# Patient Record
Sex: Female | Born: 1955 | Race: Black or African American | Hispanic: No | Marital: Single | Smoking: Never smoker
Health system: Southern US, Community
[De-identification: ages and names within clinical notes are randomized; demographics above are authoritative.]

## PROBLEM LIST (undated history)

## (undated) DIAGNOSIS — I639 Cerebral infarction, unspecified: Secondary | ICD-10-CM

## (undated) DIAGNOSIS — J309 Allergic rhinitis, unspecified: Secondary | ICD-10-CM

## (undated) DIAGNOSIS — N289 Disorder of kidney and ureter, unspecified: Secondary | ICD-10-CM

## (undated) DIAGNOSIS — E119 Type 2 diabetes mellitus without complications: Secondary | ICD-10-CM

## (undated) DIAGNOSIS — G510 Bell's palsy: Secondary | ICD-10-CM

## (undated) DIAGNOSIS — I10 Essential (primary) hypertension: Secondary | ICD-10-CM

## (undated) DIAGNOSIS — I509 Heart failure, unspecified: Secondary | ICD-10-CM

## (undated) DIAGNOSIS — R569 Unspecified convulsions: Secondary | ICD-10-CM

## (undated) DIAGNOSIS — G629 Polyneuropathy, unspecified: Secondary | ICD-10-CM

## (undated) DIAGNOSIS — E785 Hyperlipidemia, unspecified: Secondary | ICD-10-CM

## (undated) DIAGNOSIS — I2510 Atherosclerotic heart disease of native coronary artery without angina pectoris: Secondary | ICD-10-CM

## (undated) DIAGNOSIS — E079 Disorder of thyroid, unspecified: Secondary | ICD-10-CM

## (undated) DIAGNOSIS — G4733 Obstructive sleep apnea (adult) (pediatric): Secondary | ICD-10-CM

## (undated) HISTORY — PX: BARIATRIC SURGERY: SHX1103

## (undated) HISTORY — PX: CHOLECYSTECTOMY: SHX55

## (undated) HISTORY — PX: CARDIAC DEFIBRILLATOR PLACEMENT: SHX171

## (undated) HISTORY — PX: TONSILLECTOMY: SUR1361

## (undated) HISTORY — PX: APPENDECTOMY: SHX54

## (undated) HISTORY — DX: Bell's palsy: G51.0

---

## 2018-04-29 ENCOUNTER — Emergency Department (HOSPITAL_COMMUNITY)
Admission: EM | Admit: 2018-04-29 | Discharge: 2018-04-29 | Disposition: A | Discharge status: Home / Self Care | Payer: Medicare HMO | Attending: Emergency Medicine | Admitting: Emergency Medicine

## 2018-04-29 ENCOUNTER — Encounter (HOSPITAL_COMMUNITY): Payer: Self-pay | Admitting: Emergency Medicine

## 2018-04-29 DIAGNOSIS — E119 Type 2 diabetes mellitus without complications: Secondary | ICD-10-CM | POA: Insufficient documentation

## 2018-04-29 DIAGNOSIS — I10 Essential (primary) hypertension: Secondary | ICD-10-CM | POA: Insufficient documentation

## 2018-04-29 DIAGNOSIS — H5711 Ocular pain, right eye: Secondary | ICD-10-CM | POA: Diagnosis present

## 2018-04-29 DIAGNOSIS — Z9884 Bariatric surgery status: Secondary | ICD-10-CM | POA: Insufficient documentation

## 2018-04-29 DIAGNOSIS — Z95810 Presence of automatic (implantable) cardiac defibrillator: Secondary | ICD-10-CM | POA: Diagnosis not present

## 2018-04-29 DIAGNOSIS — H5789 Other specified disorders of eye and adnexa: Secondary | ICD-10-CM | POA: Diagnosis not present

## 2018-04-29 DIAGNOSIS — Z9049 Acquired absence of other specified parts of digestive tract: Secondary | ICD-10-CM | POA: Diagnosis not present

## 2018-04-29 DIAGNOSIS — H579 Unspecified disorder of eye and adnexa: Secondary | ICD-10-CM

## 2018-04-29 HISTORY — DX: Polyneuropathy, unspecified: G62.9

## 2018-04-29 HISTORY — DX: Essential (primary) hypertension: I10

## 2018-04-29 HISTORY — DX: Type 2 diabetes mellitus without complications: E11.9

## 2018-04-29 MED ORDER — TETRACAINE HCL 0.5 % OP SOLN
2.0000 [drp] | Freq: Once | OPHTHALMIC | Status: AC
  Administered 2018-04-29: 2 [drp] via OPHTHALMIC
  Filled 2018-04-29: qty 4

## 2018-04-29 MED ORDER — FLUORESCEIN SODIUM 1 MG OP STRP
1.0000 | ORAL_STRIP | Freq: Once | OPHTHALMIC | Status: AC
  Administered 2018-04-29: 1 via OPHTHALMIC
  Filled 2018-04-29: qty 1

## 2018-04-29 NOTE — ED Provider Notes (Signed)
[facility] EMERGENCY DEPARTMENT Provider Note   CSN: [id number] Arrival date & time: 04/29/18  0330     History   Chief Complaint Chief Complaint  Patient presents with  . Eye Pain    HPI Briellah Baik is a 62 y.o. female.  62 yo F with a chief complaint of right eye pain.  The patient feels that something is stuck in there.  Been going on just since this evening.  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  She denies trauma.  Unsure of her last tetanus shot.  She denies any change in vision.  Denies contact lens use  The history is provided by the patient.  Eye Problem   This is a new problem. The current episode started 3 to 5 hours ago. The problem occurs constantly. The problem has not changed since onset.There is a problem in the right eye. There was no injury mechanism. The pain is at a severity of 2/10. The pain is mild. There is no history of trauma to the eye. There is no known exposure to pink eye. She does not wear contacts. Associated symptoms include eye redness. Pertinent negatives include no nausea and no vomiting. She has tried nothing for the symptoms. The treatment provided no relief.    Past Medical History:  Diagnosis Date  . Diabetes mellitus without complication (HCC)   . Hypertension   . Neuropathy     There are no active problems to display for this patient.   Past Surgical History:  Procedure Laterality Date  . APPENDECTOMY    . BARIATRIC SURGERY    . CARDIAC DEFIBRILLATOR PLACEMENT    . CHOLECYSTECTOMY    . TONSILLECTOMY       OB History   None      Home Medications    Prior to Admission medications   Not on File    Family History No family history on file.  Social History Social History   Tobacco Use  . Smoking status: Never Smoker  Substance Use Topics  . Alcohol use: Never    Frequency: Never  . Drug use: Never     Allergies   Codeine   Review of Systems Review of Systems  Constitutional:  Negative for chills and fever.  HENT: Negative for congestion and rhinorrhea.   Eyes: Positive for pain and redness. Negative for visual disturbance.  Respiratory: Negative for shortness of breath and wheezing.   Cardiovascular: Negative for chest pain and palpitations.  Gastrointestinal: Negative for nausea and vomiting.  Genitourinary: Negative for dysuria and urgency.  Musculoskeletal: Negative for arthralgias and myalgias.  Skin: Negative for pallor and wound.  Neurological: Negative for dizziness and headaches.     Physical Exam Updated Vital Signs BP 137/76   Pulse 81   Temp (!) 97.3 F (36.3 C) (Oral)   Resp 15   SpO2 99%   Physical Exam  Constitutional: She is oriented to person, place, and time. She appears well-developed and well-nourished. No distress.  HENT:  Head: Normocephalic and atraumatic.  Eyes: Pupils are equal, round, and reactive to light. EOM are normal. Lids are everted and swept, no foreign bodies found. Right eye exhibits discharge (watery). Right conjunctiva is injected. Right conjunctiva has no hemorrhage. Left conjunctiva is not injected. Left conjunctiva has no hemorrhage. Right eye exhibits normal extraocular motion.  No noted fluorescein uptake.  No foreign body seen.  Neck: Normal range of motion. Neck supple.  Cardiovascular: Normal rate and regular rhythm. Exam reveals no  gallop and no friction rub.  No murmur heard. Pulmonary/Chest: Effort normal. She has no wheezes. She has no rales.  Abdominal: Soft. She exhibits no distension. There is no tenderness.  Musculoskeletal: She exhibits no edema or tenderness.  Neurological: She is alert and oriented to person, place, and time.  Skin: Skin is warm and dry. She is not diaphoretic.  Psychiatric: She has a normal mood and affect. Her behavior is normal.  Nursing note and vitals reviewed.    ED Treatments / Results  Labs (all labs ordered are listed, but only abnormal results are displayed) Labs  Reviewed - No data to display  EKG None  Radiology No results found.  Procedures Procedures (including critical care time)  Medications Ordered in ED Medications  fluorescein ophthalmic strip 1 strip (1 strip Right Eye Given 04/29/18 0426)  tetracaine (PONTOCAINE) 0.5 % ophthalmic solution 2 drop (2 drops Right Eye Given 04/29/18 0426)     Initial Impression / Assessment and Plan / ED Course  I have reviewed the triage vital signs and the nursing notes.  Pertinent labs & imaging results that were available during my care of the patient were reviewed by me and considered in my medical decision making (see chart for details).     62 yo F with a chief complaint of right eye pain.  Going on since this afternoon.  My exam without visible foreign body.  This may be dry eye.  We will have her do lubricating drops.  Ophthalmology follow-up as needed.  4:33 AM:  I have discussed the diagnosis/risks/treatment options with the patient and believe the pt to be eligible for discharge home to follow-up with PCP, ophtho. We also discussed returning to the ED immediately if new or worsening sx occur. We discussed the sx which are most concerning (e.g., sudden worsening pain, vision change) that necessitate immediate return. Medications administered to the patient during their visit and any new prescriptions provided to the patient are listed below.  Medications given during this visit Medications  fluorescein ophthalmic strip 1 strip (1 strip Right Eye Given 04/29/18 0426)  tetracaine (PONTOCAINE) 0.5 % ophthalmic solution 2 drop (2 drops Right Eye Given 04/29/18 0426)      The patient appears reasonably screen and/or stabilized for discharge and I doubt any other medical condition or other [facility] requiring further screening, evaluation, or treatment in the ED at this time prior to discharge.    Final Clinical Impressions(s) / ED Diagnoses   Final diagnoses:  Sensation of foreign body in eye     ED Discharge Orders    None       Melene Plan, DO 04/29/18 [id number]

## 2018-04-29 NOTE — ED Triage Notes (Signed)
Pt reports R eye pain and watering that began last night. Denies any crusting of eye or any recent known trauma.

## 2018-04-29 NOTE — Discharge Instructions (Addendum)
Try preservative-free eyedrops.  You can buy these over-the-counter.  Open a file and use it as often as you on throughout the day and then throw it away at the end of the day.  Follow-up with your family doctor.  If this continues you may want to see an eye doctor.

## 2019-04-13 ENCOUNTER — Emergency Department (HOSPITAL_COMMUNITY): Payer: Medicare HMO

## 2019-04-13 ENCOUNTER — Encounter (HOSPITAL_COMMUNITY): Payer: Self-pay | Admitting: Emergency Medicine

## 2019-04-13 ENCOUNTER — Observation Stay (HOSPITAL_COMMUNITY)
Admission: EM | Admit: 2019-04-13 | Discharge: 2019-04-14 | Disposition: A | Discharge status: Home / Self Care | Payer: Medicare HMO | Attending: Internal Medicine | Admitting: Internal Medicine

## 2019-04-13 ENCOUNTER — Other Ambulatory Visit: Payer: Self-pay

## 2019-04-13 DIAGNOSIS — Z20828 Contact with and (suspected) exposure to other viral communicable diseases: Secondary | ICD-10-CM | POA: Diagnosis not present

## 2019-04-13 DIAGNOSIS — E119 Type 2 diabetes mellitus without complications: Secondary | ICD-10-CM | POA: Insufficient documentation

## 2019-04-13 DIAGNOSIS — Z95810 Presence of automatic (implantable) cardiac defibrillator: Secondary | ICD-10-CM | POA: Insufficient documentation

## 2019-04-13 DIAGNOSIS — I2510 Atherosclerotic heart disease of native coronary artery without angina pectoris: Secondary | ICD-10-CM | POA: Diagnosis not present

## 2019-04-13 DIAGNOSIS — R42 Dizziness and giddiness: Secondary | ICD-10-CM | POA: Diagnosis not present

## 2019-04-13 DIAGNOSIS — Z8673 Personal history of transient ischemic attack (TIA), and cerebral infarction without residual deficits: Secondary | ICD-10-CM | POA: Diagnosis not present

## 2019-04-13 DIAGNOSIS — D649 Anemia, unspecified: Secondary | ICD-10-CM | POA: Diagnosis not present

## 2019-04-13 DIAGNOSIS — I429 Cardiomyopathy, unspecified: Secondary | ICD-10-CM | POA: Diagnosis not present

## 2019-04-13 DIAGNOSIS — I10 Essential (primary) hypertension: Secondary | ICD-10-CM | POA: Diagnosis present

## 2019-04-13 DIAGNOSIS — R531 Weakness: Secondary | ICD-10-CM | POA: Diagnosis present

## 2019-04-13 DIAGNOSIS — Z79899 Other long term (current) drug therapy: Secondary | ICD-10-CM | POA: Insufficient documentation

## 2019-04-13 DIAGNOSIS — G459 Transient cerebral ischemic attack, unspecified: Secondary | ICD-10-CM

## 2019-04-13 DIAGNOSIS — R55 Syncope and collapse: Secondary | ICD-10-CM

## 2019-04-13 HISTORY — DX: Cerebral infarction, unspecified: I63.9

## 2019-04-13 HISTORY — DX: Atherosclerotic heart disease of native coronary artery without angina pectoris: I25.10

## 2019-04-13 HISTORY — DX: Disorder of thyroid, unspecified: E07.9

## 2019-04-13 HISTORY — DX: Hyperlipidemia, unspecified: E78.5

## 2019-04-13 HISTORY — DX: Allergic rhinitis, unspecified: J30.9

## 2019-04-13 HISTORY — DX: Unspecified convulsions: R56.9

## 2019-04-13 LAB — COMPREHENSIVE METABOLIC PANEL
ALT: 30 U/L (ref 0–44)
AST: 33 U/L (ref 15–41)
Albumin: 3.6 g/dL (ref 3.5–5.0)
Alkaline Phosphatase: 111 U/L (ref 38–126)
Anion gap: 10 (ref 5–15)
BUN: 19 mg/dL (ref 6–20)
CO2: 22 mmol/L (ref 22–32)
Calcium: 9.1 mg/dL (ref 8.9–10.3)
Chloride: 109 mmol/L (ref 98–111)
Creatinine, Ser: 1.36 mg/dL — ABNORMAL HIGH (ref 0.44–1.00)
GFR calc Af Amer: 49 mL/min — ABNORMAL LOW (ref >60)
GFR calc non Af Amer: 42 mL/min — ABNORMAL LOW (ref >60)
Glucose, Bld: 137 mg/dL — ABNORMAL HIGH (ref 70–99)
Potassium: 3.7 mmol/L (ref 3.5–5.1)
Sodium: 141 mmol/L (ref 135–145)
Total Bilirubin: 0.4 mg/dL (ref 0.3–1.2)
Total Protein: 6.2 g/dL — ABNORMAL LOW (ref 6.5–8.1)

## 2019-04-13 LAB — DIFFERENTIAL
Abs Immature Granulocytes: 0.01 10*3/uL (ref 0.00–0.07)
Basophils Absolute: 0 10*3/uL (ref 0.0–0.1)
Basophils Relative: 1 %
Eosinophils Absolute: 0.1 10*3/uL (ref 0.0–0.5)
Eosinophils Relative: 2 %
Immature Granulocytes: 0 %
Lymphocytes Relative: 48 %
Lymphs Abs: 2.8 10*3/uL (ref 0.7–4.0)
Monocytes Absolute: 0.3 10*3/uL (ref 0.1–1.0)
Monocytes Relative: 6 %
Neutro Abs: 2.5 10*3/uL (ref 1.7–7.7)
Neutrophils Relative %: 43 %

## 2019-04-13 LAB — ETHANOL: Alcohol, Ethyl (B): <10 mg/dL (ref <10)

## 2019-04-13 LAB — CBC
HCT: 32.9 % — ABNORMAL LOW (ref 36.0–46.0)
HCT: 33.2 % — ABNORMAL LOW (ref 36.0–46.0)
Hemoglobin: 10.1 g/dL — ABNORMAL LOW (ref 12.0–15.0)
Hemoglobin: 10.5 g/dL — ABNORMAL LOW (ref 12.0–15.0)
MCH: 22.5 pg — ABNORMAL LOW (ref 26.0–34.0)
MCH: 22.8 pg — ABNORMAL LOW (ref 26.0–34.0)
MCHC: 30.7 g/dL (ref 30.0–36.0)
MCHC: 31.6 g/dL (ref 30.0–36.0)
MCV: 73.4 fL — ABNORMAL LOW (ref 80.0–100.0)
MCV: 72.2 fL — ABNORMAL LOW (ref 80.0–100.0)
Platelets: 167 10*3/uL (ref 150–400)
Platelets: 164 10*3/uL (ref 150–400)
RBC: 4.48 MIL/uL (ref 3.87–5.11)
RBC: 4.6 MIL/uL (ref 3.87–5.11)
RDW: 15.5 % (ref 11.5–15.5)
RDW: 15.7 % — ABNORMAL HIGH (ref 11.5–15.5)
WBC: 5.9 10*3/uL (ref 4.0–10.5)
WBC: 7.9 10*3/uL (ref 4.0–10.5)
nRBC: 0 % (ref 0.0–0.2)
nRBC: 0 % (ref 0.0–0.2)

## 2019-04-13 LAB — PROTIME-INR
INR: 1.2 (ref 0.8–1.2)
Prothrombin Time: 15.1 seconds (ref 11.4–15.2)

## 2019-04-13 LAB — HEMOGLOBIN A1C
Hgb A1c MFr Bld: 6 % — ABNORMAL HIGH (ref 4.8–5.6)
Mean Plasma Glucose: 125.5 mg/dL

## 2019-04-13 LAB — I-STAT CHEM 8, ED
BUN: 23 mg/dL — ABNORMAL HIGH (ref 6–20)
Calcium, Ion: 1.17 mmol/L (ref 1.15–1.40)
Chloride: 108 mmol/L (ref 98–111)
Creatinine, Ser: 1.3 mg/dL — ABNORMAL HIGH (ref 0.44–1.00)
Glucose, Bld: 130 mg/dL — ABNORMAL HIGH (ref 70–99)
HCT: 34 % — ABNORMAL LOW (ref 36.0–46.0)
Hemoglobin: 11.6 g/dL — ABNORMAL LOW (ref 12.0–15.0)
Potassium: 3.7 mmol/L (ref 3.5–5.1)
Sodium: 142 mmol/L (ref 135–145)
TCO2: 22 mmol/L (ref 22–32)

## 2019-04-13 LAB — I-STAT BETA HCG BLOOD, ED (MC, WL, AP ONLY): I-stat hCG, quantitative: <5 m[IU]/mL (ref <5)

## 2019-04-13 LAB — CBG MONITORING, ED: Glucose-Capillary: 128 mg/dL — ABNORMAL HIGH (ref 70–99)

## 2019-04-13 LAB — TROPONIN I (HIGH SENSITIVITY): Troponin I (High Sensitivity): <2 ng/L (ref <18)

## 2019-04-13 LAB — APTT: aPTT: 27 seconds (ref 24–36)

## 2019-04-13 LAB — LACTIC ACID, PLASMA: Lactic Acid, Venous: 1.1 mmol/L (ref 0.5–1.9)

## 2019-04-13 MED ORDER — ACETAMINOPHEN 160 MG/5ML PO SOLN: 650.0000 mg | ORAL | Status: DC | PRN

## 2019-04-13 MED ORDER — IOHEXOL 350 MG/ML SOLN
100.0000 mL | Freq: Once | INTRAVENOUS | Status: AC | PRN
  Administered 2019-04-13: 100 mL via INTRAVENOUS

## 2019-04-13 MED ORDER — ASPIRIN 300 MG RE SUPP: 300.0000 mg | Freq: Every day | RECTAL | Status: DC

## 2019-04-13 MED ORDER — IOHEXOL 350 MG/ML SOLN
80.0000 mL | Freq: Once | INTRAVENOUS | Status: AC | PRN
  Administered 2019-04-13: 80 mL via INTRAVENOUS

## 2019-04-13 MED ORDER — INSULIN ASPART 100 UNIT/ML ~~LOC~~ SOLN: 0.0000 [IU] | SUBCUTANEOUS | Status: DC

## 2019-04-13 MED ORDER — ENOXAPARIN SODIUM 40 MG/0.4ML ~~LOC~~ SOLN
40.0000 mg | Freq: Every day | SUBCUTANEOUS | Status: DC
  Administered 2019-04-14: 40 mg via SUBCUTANEOUS
  Filled 2019-04-13: qty 0.4

## 2019-04-13 MED ORDER — SODIUM CHLORIDE 0.9 % IV SOLN
INTRAVENOUS | Status: AC
  Administered 2019-04-14: 01:00:00 via INTRAVENOUS

## 2019-04-13 MED ORDER — ASPIRIN 325 MG PO TABS
325.0000 mg | ORAL_TABLET | Freq: Every day | ORAL | Status: DC
  Administered 2019-04-14: 325 mg via ORAL
  Filled 2019-04-13: qty 1

## 2019-04-13 MED ORDER — ACETAMINOPHEN 325 MG PO TABS: 650.0000 mg | ORAL_TABLET | ORAL | Status: DC | PRN

## 2019-04-13 MED ORDER — ACETAMINOPHEN 650 MG RE SUPP: 650.0000 mg | RECTAL | Status: DC | PRN

## 2019-04-13 MED ORDER — STROKE: EARLY STAGES OF RECOVERY BOOK
Freq: Once | Status: AC
  Administered 2019-04-14: 01:00:00

## 2019-04-13 NOTE — ED Notes (Signed)
Pt failed swallow screen previously. Will continue to keep NPO

## 2019-04-13 NOTE — ED Notes (Signed)
Pt transported to CT ?

## 2019-04-13 NOTE — H&P (Signed)
History and Physical    Alexandra Ingram BTD:[id number] DOB: 05-20-1956 DOA: 04/13/2019  PCP: Shon Hough, MD  Patient coming from: Home.  Chief Complaint: Left-sided weakness.  Difficulty speaking.  History obtained from patient's daughter.  HPI: Alexandra Ingram is a 63 y.o. female with previous history of stroke with left-sided weakness, cardiomyopathy status post AICD, hypertension usually follows at [facility] is presently living at patient's daughter's house last few months at around 4 PM started having difficulty getting up from the commode and EMS was called.  Patient was noticed to have left facial droop and left-sided weakness and was brought to the ER.  Patient also had some speech difficulties.  ED Course: In the ER CT head followed by CT angiogram of the head and neck did not show any large vessel obstruction.  On exam patient has weakness of the left upper and lower extremity.  Patient failed swallow evaluation.  Neurology has examined the patient at this time admitted for possible TIA versus stroke.  Unable to do MRI due to AICD.  Labs show WBC of 7.9 hemoglobin 10.5 platelets 164 creatinine 1.02 sodium 140.  EKG normal sinus rhythm.  Patient blood pressure was in the low normal and at this time neurology recommended to keep blood pressure high.  Review of Systems: As per HPI, rest all negative.   Past Medical History:  Diagnosis Date   Allergic rhinitis    Coronary artery disease    CVA (cerebral vascular accident) ([place])    Diabetes mellitus without complication ([place])    Hyperlipidemia    Hypertension    Neuropathy    Seizures ([place])    Stroke ([place])    Thyroid disease     Past Surgical History:  Procedure Laterality Date   APPENDECTOMY     BARIATRIC SURGERY     CARDIAC DEFIBRILLATOR PLACEMENT     CHOLECYSTECTOMY     TONSILLECTOMY       reports that she has never smoked. She has never used smokeless tobacco. She reports that she  does not drink alcohol or use drugs.  Allergies  Allergen Reactions   Codeine Anaphylaxis    Family History  Problem Relation Age of Onset   Stroke Sister    Diabetes Mellitus II Sister    Stroke Brother     Prior to Admission medications   Medication Sig Start Date End Date Taking? Authorizing Provider  atorvastatin (LIPITOR) 40 MG tablet Take 40 mg by mouth daily.   Yes [provider]  carvedilol (COREG) 12.5 MG tablet Take 12.5 mg by mouth 2 (two) times daily with a meal.   Yes [provider]  gabapentin (NEURONTIN) 300 MG capsule Take 300 mg by mouth 2 (two) times daily.   Yes [provider]  lisinopril (ZESTRIL) 5 MG tablet Take 5 mg by mouth daily.   Yes [provider]  mirabegron ER (MYRBETRIQ) 25 MG TB24 tablet Take 25 mg by mouth daily. 02/14/19 02/14/20 Yes [provider]  Semaglutide, 1 MG/DOSE, 2 MG/1.5ML SOPN Inject 1 mg into the skin every 7 (seven) days. Patient uses on Friday of each week   Yes [provider]  spironolactone (ALDACTONE) 25 MG tablet Take 25 mg by mouth daily. 11/22/18  Yes [provider]  tolterodine (DETROL LA) 2 MG 24 hr capsule Take 2 mg by mouth at bedtime. 10/07/18  Yes [provider]    Physical Exam: Constitutional: Moderately built and nourished. Vitals:   04/13/19  2030 04/13/19 2045 04/13/19 2115 04/13/19 2145  BP: 125/75 126/76 124/79   Pulse: 77 77 77 85  Resp: Temp:      SpO2: 97% 96% 97% 98%   Eyes: Anicteric no pallor. ENMT: No discharge from the ears eyes nose and mouth. Neck: No mass felt.  No neck rigidity. Respiratory: No rhonchi or crepitations. Cardiovascular: S1-S2 heard. Abdomen: Soft nontender bowel sounds present. Musculoskeletal: No edema. Skin: No rash. Neurologic: Alert awake oriented to time place and person.  Has left upper extremity 0 x 5 with left lower extremity 4 x 5.  No facial asymmetry.  Tongue is  midline. Psychiatric: Appears normal.   Labs on Admission: I have personally reviewed following labs and imaging studies  CBC: Recent Labs  Lab 04/13/19 1630 04/13/19 1638  WBC 5.9  --   NEUTROABS 2.5  --   HGB 10.1* 11.6*  HCT 32.9* 34.0*  MCV 73.4*  --   PLT 167  --    Basic Metabolic Panel: Recent Labs  Lab 04/13/19 1630 04/13/19 1638  NA 141 142  K 3.7 3.7  CL 109 108  CO2 22  --   GLUCOSE 137* 130*  BUN 19 23*  CREATININE 1.36* 1.30*  CALCIUM 9.1  --    GFR: CrCl cannot be calculated (Unknown ideal weight.). Liver Function Tests: Recent Labs  Lab 04/13/19 1630  AST 33  ALT 30  ALKPHOS 111  BILITOT 0.4  PROT 6.2*  ALBUMIN 3.6   No results for input(s): LIPASE, AMYLASE in the last 168 hours. No results for input(s): AMMONIA in the last 168 hours. Coagulation Profile: Recent Labs  Lab 04/13/19 1719  INR 1.2   Cardiac Enzymes: No results for input(s): CKTOTAL, CKMB, CKMBINDEX, TROPONINI in the last 168 hours. BNP (last 3 results) No results for input(s): PROBNP in the last 8760 hours. HbA1C: No results for input(s): HGBA1C in the last 72 hours. CBG: Recent Labs  Lab 04/13/19 1630  GLUCAP 128*   Lipid Profile: No results for input(s): CHOL, HDL, LDLCALC, TRIG, CHOLHDL, LDLDIRECT in the last 72 hours. Thyroid Function Tests: No results for input(s): TSH, T4TOTAL, FREET4, T3FREE, THYROIDAB in the last 72 hours. Anemia Panel: No results for input(s): VITAMINB12, FOLATE, FERRITIN, TIBC, IRON, RETICCTPCT in the last 72 hours. Urine analysis: No results found for: COLORURINE, APPEARANCEUR, LABSPEC, PHURINE, GLUCOSEU, HGBUR, BILIRUBINUR, KETONESUR, PROTEINUR, UROBILINOGEN, NITRITE, LEUKOCYTESUR Sepsis Labs: (procalcitonin:4,lacticidven:4) )No results found for this or any previous visit (from the past 240 hour(s)).   Radiological Exams on Admission: Ct Code Stroke Cta Head W/wo Contrast  Result Date: 04/13/2019 CLINICAL DATA:   Stroke.  Left-sided weakness and facial droop EXAM: CT ANGIOGRAPHY HEAD AND NECK CT PERFUSION BRAIN TECHNIQUE: Multidetector CT imaging of the head and neck was performed using the standard protocol during bolus administration of intravenous contrast. Multiplanar CT image reconstructions and MIPs were obtained to evaluate the vascular anatomy. Carotid stenosis measurements (when applicable) are obtained utilizing NASCET criteria, using the distal internal carotid diameter as the denominator. Multiphase CT imaging of the brain was performed following IV bolus contrast injection. Subsequent parametric perfusion maps were calculated using RAPID software. CONTRAST:  OMNIPAQUE IOHEXOL 350 MG/ML SOLN COMPARISON:  CT head 04/13/2019 FINDINGS: CTA NECK FINDINGS Aortic arch: Standard branching. Imaged portion shows no evidence of aneurysm or dissection. No significant stenosis of the major arch vessel origins. Right carotid system: No evidence of dissection, right carotid widely patent. Minimal calcification right carotid bulb  without stenosis Left carotid system: Left carotid widely patent without stenosis. No significant atherosclerotic disease. Vertebral arteries: Calcific plaque at the origin of the left vertebral artery with mild-to-moderate stenosis. Remainder of the left vertebral artery is patent to the basilar. Right vertebral artery widely patent without stenosis. Skeleton: Cervical spondylosis.  No acute skeletal abnormality. Other neck: Negative for mass or adenopathy in the neck. Upper chest: Lung apices clear bilaterally. Review of the MIP images confirms the above findings CTA HEAD FINDINGS Anterior circulation: Cavernous carotid widely patent bilaterally without stenosis. Anterior and middle cerebral arteries patent bilaterally without stenosis or large vessel occlusion Posterior circulation: Both vertebral arteries widely patent to the basilar. PICA not visualized. AICA patent bilaterally basilar  widely patent. Superior cerebellar and posterior cerebral arteries patent bilaterally. Venous sinuses: Normal venous enhancement Anatomic variants: None Review of the MIP images confirms the above findings CT Brain Perfusion Findings: ASPECTS: 10 CBF (<30%) Volume: 0mL Perfusion (Tmax>6.0s) volume: 0mL Mismatch Volume: 0mL Infarction Location:None IMPRESSION: 1. CT perfusion negative for acute infarct or ischemia 2. Negative for emergent large vessel intracranial occlusion 3. No significant carotid stenosis. Mild to moderate stenosis origin of left vertebral artery. Electronically Signed   By: Marlan Palau M.D.   On: 04/13/2019 17:20   Ct Head Wo Contrast  Result Date: 04/13/2019 CLINICAL DATA:  Evaluate for infarct. EXAM: CT HEAD WITHOUT CONTRAST TECHNIQUE: Contiguous axial images were obtained from the base of the skull through the vertex without intravenous contrast. COMPARISON:  Earlier today FINDINGS: Brain: No evidence of acute infarction, hemorrhage, hydrocephalus, extra-axial collection or mass lesion/mass effect. Vascular: No hyperdense vessel or unexpected calcification. Skull: Normal. Negative for fracture or focal lesion. Sinuses/Orbits: No acute finding. Other: None IMPRESSION: No acute intracranial abnormalities. Normal brain. Electronically Signed   By: Signa Kell M.D.   On: 04/13/2019 19:41   Ct Code Stroke Cta Neck W/wo Contrast  Result Date: 04/13/2019 CLINICAL DATA:  Stroke.  Left-sided weakness and facial droop EXAM: CT ANGIOGRAPHY HEAD AND NECK CT PERFUSION BRAIN TECHNIQUE: Multidetector CT imaging of the head and neck was performed using the standard protocol during bolus administration of intravenous contrast. Multiplanar CT image reconstructions and MIPs were obtained to evaluate the vascular anatomy. Carotid stenosis measurements (when applicable) are obtained utilizing NASCET criteria, using the distal internal carotid diameter as the denominator. Multiphase CT imaging of  the brain was performed following IV bolus contrast injection. Subsequent parametric perfusion maps were calculated using RAPID software. CONTRAST:  OMNIPAQUE IOHEXOL 350 MG/ML SOLN COMPARISON:  CT head 04/13/2019 FINDINGS: CTA NECK FINDINGS Aortic arch: Standard branching. Imaged portion shows no evidence of aneurysm or dissection. No significant stenosis of the major arch vessel origins. Right carotid system: No evidence of dissection, right carotid widely patent. Minimal calcification right carotid bulb without stenosis Left carotid system: Left carotid widely patent without stenosis. No significant atherosclerotic disease. Vertebral arteries: Calcific plaque at the origin of the left vertebral artery with mild-to-moderate stenosis. Remainder of the left vertebral artery is patent to the basilar. Right vertebral artery widely patent without stenosis. Skeleton: Cervical spondylosis.  No acute skeletal abnormality. Other neck: Negative for mass or adenopathy in the neck. Upper chest: Lung apices clear bilaterally. Review of the MIP images confirms the above findings CTA HEAD FINDINGS Anterior circulation: Cavernous carotid widely patent bilaterally without stenosis. Anterior and middle cerebral arteries patent bilaterally without stenosis or large vessel occlusion Posterior circulation: Both vertebral arteries widely patent to the basilar. PICA not visualized. AICA patent  bilaterally basilar widely patent. Superior cerebellar and posterior cerebral arteries patent bilaterally. Venous sinuses: Normal venous enhancement Anatomic variants: None Review of the MIP images confirms the above findings CT Brain Perfusion Findings: ASPECTS: 10 CBF (<30%) Volume: 70mL Perfusion (Tmax>6.0s) volume: 53mL Mismatch Volume: 100mL Infarction Location:None IMPRESSION: 1. CT perfusion negative for acute infarct or ischemia 2. Negative for emergent large vessel intracranial occlusion 3. No significant carotid stenosis. Mild to  moderate stenosis origin of left vertebral artery. Electronically Signed   By: Marlan Palau M.D.   On: 04/13/2019 17:20   Ct Code Stroke Cta Cerebral Perfusion W/wo Contrast  Result Date: 04/13/2019 CLINICAL DATA:  Stroke.  Left-sided weakness and facial droop EXAM: CT ANGIOGRAPHY HEAD AND NECK CT PERFUSION BRAIN TECHNIQUE: Multidetector CT imaging of the head and neck was performed using the standard protocol during bolus administration of intravenous contrast. Multiplanar CT image reconstructions and MIPs were obtained to evaluate the vascular anatomy. Carotid stenosis measurements (when applicable) are obtained utilizing NASCET criteria, using the distal internal carotid diameter as the denominator. Multiphase CT imaging of the brain was performed following IV bolus contrast injection. Subsequent parametric perfusion maps were calculated using RAPID software. CONTRAST:  OMNIPAQUE IOHEXOL 350 MG/ML SOLN COMPARISON:  CT head 04/13/2019 FINDINGS: CTA NECK FINDINGS Aortic arch: Standard branching. Imaged portion shows no evidence of aneurysm or dissection. No significant stenosis of the major arch vessel origins. Right carotid system: No evidence of dissection, right carotid widely patent. Minimal calcification right carotid bulb without stenosis Left carotid system: Left carotid widely patent without stenosis. No significant atherosclerotic disease. Vertebral arteries: Calcific plaque at the origin of the left vertebral artery with mild-to-moderate stenosis. Remainder of the left vertebral artery is patent to the basilar. Right vertebral artery widely patent without stenosis. Skeleton: Cervical spondylosis.  No acute skeletal abnormality. Other neck: Negative for mass or adenopathy in the neck. Upper chest: Lung apices clear bilaterally. Review of the MIP images confirms the above findings CTA HEAD FINDINGS Anterior circulation: Cavernous carotid widely patent bilaterally without stenosis. Anterior and  middle cerebral arteries patent bilaterally without stenosis or large vessel occlusion Posterior circulation: Both vertebral arteries widely patent to the basilar. PICA not visualized. AICA patent bilaterally basilar widely patent. Superior cerebellar and posterior cerebral arteries patent bilaterally. Venous sinuses: Normal venous enhancement Anatomic variants: None Review of the MIP images confirms the above findings CT Brain Perfusion Findings: ASPECTS: 10 CBF (<30%) Volume: 8mL Perfusion (Tmax>6.0s) volume: 28mL Mismatch Volume: 31mL Infarction Location:None IMPRESSION: 1. CT perfusion negative for acute infarct or ischemia 2. Negative for emergent large vessel intracranial occlusion 3. No significant carotid stenosis. Mild to moderate stenosis origin of left vertebral artery. Electronically Signed   By: Marlan Palau M.D.   On: 04/13/2019 17:20   Ct Angio Chest/abd/pel For Dissection W And/or Wo Contrast  Result Date: 04/13/2019 CLINICAL DATA:  Chest and back pain, question of aortic dissection EXAM: CT ANGIOGRAPHY CHEST, ABDOMEN AND PELVIS TECHNIQUE: Multidetector CT imaging through the chest, abdomen and pelvis was performed using the standard protocol during bolus administration of intravenous contrast. Multiplanar reconstructed images and MIPs were obtained and reviewed to evaluate the vascular anatomy. CONTRAST:  3mL OMNIPAQUE IOHEXOL 350 MG/ML SOLN COMPARISON:  None. FINDINGS: CTA CHEST FINDINGS Cardiovascular: --Heart: The heart size is normal.  There is nopericardial effusion. --Aorta: The course and caliber of the thoracic aorta are normal. There is mild scattered aortic atherosclerotic calcification. Precontrast images show no aortic intramural hematoma. There is no blood pool,  dissection or penetrating ulcer demonstrated on arterial phase postcontrast imaging. There is a conventional 3 vessel aortic arch branching pattern. The proximal arch vessels are widely patent. --Pulmonary Arteries:  Contrast timing is optimized for preferential opacification of the aorta. Within that limitation, normal central pulmonary arteries. Mediastinum/Nodes: A left-sided pacemaker seen with the lead tips in the right ventricle. No mediastinal, hilar or axillary lymphadenopathy. The visualized thyroid and thoracic esophageal course are unremarkable. Lungs/Pleura: No pulmonary nodules or masses. No pleural effusion or pneumothorax. No focal airspace consolidation. No focal pleural abnormality. Musculoskeletal: No chest wall abnormality. No acute osseous findings. Review of the MIP images confirms the above findings. CTA ABDOMEN AND PELVIS FINDINGS VASCULAR Aorta: Normal caliber aorta without aneurysm, dissection, vasculitis or hemodynamically significant stenosis. There is mild aortic atherosclerosis. Celiac: No aneurysm, dissection or hemodynamically significant stenosis. Normal branching pattern SMA: Widely patent without dissection or stenosis. Renals: Single renal arteries bilaterally. No aneurysm, dissection, stenosis or evidence of fibromuscular dysplasia. IMA: Patent without abnormality. Inflow: No aneurysm, stenosis or dissection. Veins: Normal course and caliber of the major veins. Assessment is otherwise limited by the arterial dominant contrast phase. Review of the MIP images confirms the above findings. NON-VASCULAR Hepatobiliary: Normal hepatic contours and density. No visible biliary dilatation. The patient is status post cholecystectomy. No biliary ductal dilation. Pancreas: Normal contours without ductal dilatation. No peripancreatic fluid collection. Spleen: Normal arterial phase splenic enhancement pattern. Adrenals/Urinary Tract: --Adrenal glands: Normal. --Right kidney/ureter: Multiple scattered renal calculi are noted, less than 1 cm. No hydronephrosis however is seen. --Left kidney/ureter: Multiple scattered renal calculi are noted, less than 1 cm. No hydronephrosis. There is a low-density 1 cm lesion  seen in the upper pole the left kidney. --Urinary bladder: Unremarkable. Stomach/Bowel: --Stomach/Duodenum: A small hiatal hernia is present. The patient has had prior gastric sleeve. --Small bowel: No dilatation or inflammation. --Colon: Scattered colonic diverticula are noted without diverticulitis. --Appendix: Normal. Lymphatic:  No abdominal or pelvic lymphadenopathy. Reproductive: No free fluid in the pelvis. Musculoskeletal. No bony spinal canal stenosis or focal osseous abnormality. Degenerative changes are seen in the lower thoracic spine. Other: None. Review of the MIP images confirms the above findings. IMPRESSION: 1. No acute aortic abnormality. 2. Mild aortic Atherosclerosis (ICD10-I70.0). 3. Multiple bilateral subcentimeter nonobstructing renal calculi. 4. No acute intrathoracic pathology. Electronically Signed   By: Jonna Clark M.D.   On: 04/13/2019 21:54   Ct Head Code Stroke Wo Contrast  Result Date: 04/13/2019 CLINICAL DATA:  Code stroke.  Left-sided weakness and facial droop. EXAM: CT HEAD WITHOUT CONTRAST TECHNIQUE: Contiguous axial images were obtained from the base of the skull through the vertex without intravenous contrast. COMPARISON:  None. FINDINGS: Brain: Normal appearance of brain parenchyma without evidence of old or acute infarction, mass lesion, hemorrhage, hydrocephalus or extra-axial collection. Vascular: There is atherosclerotic calcification of the major vessels at the base of the brain. Skull: Normal Sinuses/Orbits: Clear Other: None ASPECTS ([place] Stroke Program Early CT Score) - Ganglionic level infarction (caudate, lentiform nuclei, internal capsule, insula, M1-M3 cortex): 7 - Supraganglionic infarction (M4-M6 cortex): 3 Total score (0-10 with 10 being normal): 10 IMPRESSION: 1. Normal appearance of the brain parenchyma. No sign of old or acute infarction. There is atherosclerotic calcification of the major vessels at the base of the brain. 2. ASPECTS is 10 3. These  results were communicated to Dr. Otelia Limes at 4:44 pmon 10/22/2020by text page via the [facility] messaging system. Electronically Signed   By: Paulina Fusi M.D.   On:  04/13/2019 16:45    EKG: Independently reviewed.  Normal sinus rhythm  Assessment/Plan Principal Problem:   TIA (transient ischemic attack) Active Problems:   Cardiomyopathy (HCC)   History of stroke   Normocytic normochromic anemia   Essential hypertension    1. TIA versus stroke -unable to do MRI brain because of patient having AICD will admit repeat CT head in 24 hours.  Patient failed swallow will need to get speech therapist consult.  Physical therapy consult.  Continue aspirin and statins.  Check hemoglobin A1c lipid panel.  Neurochecks. 2. History of hypertension cardiomyopathy holding diuretics and antihypertensives due to low normal blood pressure.  Concerning for watershed infarct. 3. Anemia follow CBC. 4. Diabetes mellitus type 2 we will keep patient on sliding scale coverage.   DVT prophylaxis: Lovenox. Code Status: Full code. Family Communication: Patient's daughter. Disposition Plan: Home. Consults called: Neurology. Admission status: Observation.   Eduard ClosArshad N Kynlei Piontek MD [facility] Pager [phone].  If 7PM-7AM, please contact night-coverage [email or web address] Password [email or web address]  04/13/2019, 10:43 PM

## 2019-04-13 NOTE — ED Notes (Signed)
Jassica, pt's daughter, leaving to go home; would like to be contacted when pt gets admitted; number listed in the chart

## 2019-04-13 NOTE — ED Triage Notes (Signed)
Pt states that she always has left side weakness when speaking to ED dr.

## 2019-04-13 NOTE — Consult Note (Signed)
Referring Physician: Dr. Dalene Seltzer    Chief Complaint: Left sided weakness  HPI: Alexandra Ingram is an 63 y.o. female with a prior history of stroke without residual deficit who today "could not get off the toilet" per family and also appeared overall weak with a left sided facial droop. They called 911. On EMS arrival fire was on scene. CBG 82. Radial pulses were weak while still on the toilet, leaning off to the left. She was helped off the toilet and then facial droop on the left was noted as well as left sided arm and leg weakness. Had soft-spoken speech unclear if it was slurred. No gaze preference. Pupils were equal and reactive. BP on scene was 54/30's. BP improved with fluids to 94/68 at the end of the transport by ambulance to the ED her SBP was further increased to 120.   CT head: No acute intracranial abnormality  CTA/CTP: 1. CT perfusion negative for acute infarct or ischemia 2. Negative for emergent large vessel intracranial occlusion 3. No significant carotid stenosis. Mild to moderate stenosis origin of left vertebral artery.  LSN: [id number] tPA Given: No:  IV tPA not indicated due to inconsistencies on exam suggestive of a presentation that was not secondary to ischemic stroke, with CTA/CTP to resolve the inconsistencies revealing no perfusion deficit or LVO.     Past Medical History:  Diagnosis Date  . Diabetes mellitus without complication (HCC)   . Hypertension   . Neuropathy     Past Surgical History:  Procedure Laterality Date  . APPENDECTOMY    . BARIATRIC SURGERY    . CARDIAC DEFIBRILLATOR PLACEMENT    . CHOLECYSTECTOMY    . TONSILLECTOMY      No family history on file. Social History:  reports that she has never smoked. She does not have any smokeless tobacco history on file. She reports that she does not drink alcohol or use drugs.  Allergies:  Allergies  Allergen Reactions  . Codeine Anaphylaxis    Medications:  No current facility-administered  medications on file prior to encounter.    Current Outpatient Medications on File Prior to Encounter  Medication Sig Dispense Refill  . atorvastatin (LIPITOR) 40 MG tablet Take 40 mg by mouth daily.    . carvedilol (COREG) 12.5 MG tablet Take 12.5 mg by mouth 2 (two) times daily with a meal.    . gabapentin (NEURONTIN) 300 MG capsule Take 300 mg by mouth 2 (two) times daily.    Marland Kitchen lisinopril (ZESTRIL) 5 MG tablet Take 5 mg by mouth daily.    . mirabegron ER (MYRBETRIQ) 25 MG TB24 tablet Take 25 mg by mouth daily.    . Semaglutide, 1 MG/DOSE, 2 MG/1.5ML SOPN Inject 1 mg into the skin every 7 (seven) days. Patient uses on Friday of each week    . spironolactone (ALDACTONE) 25 MG tablet Take 25 mg by mouth daily.    Marland Kitchen tolterodine (DETROL LA) 2 MG 24 hr capsule Take 2 mg by mouth at bedtime.       ROS: As per HPI. Detailed ROS deferred in the context of acuity of presentation.    Physical Examination: There were no vitals taken for this visit.  HEENT: [place]/At Lungs: Respirations unlabored Ext: No edema  Neurologic Examination: Ment: Awake. Eyes closed and tends not to open to command. Poor cooperation with exam. Lag in responses to questions is inconsistent, depending upon question content. Pained, dysthymic and anxious affect. Initially speaks with hushed tones and constricted lip/mouth/jaw movements  that did not fit a pattern of physiological hypophonia and were most consistent with embellishment. When informed that examiner could not understand her, patient's speech became clearer and louder, with normalized lip/jaw movements. CN: Fixates and tracks normally with coaching, but exhibited reluctance initially. Poor cooperation - cannot definitively assess visual fields. PERRL. EOM full without nystagmus or forced gaze deviation. Reacts to tactile stimulation. Face symmetric at baseline and with speech, but when asked to grimace, she exhibited contractions that were essentially equal in strength  but asymmetric - jaw moved side to side when asked to grimace, which appeared most consistent with embellishment. Hearing intact to voice. Phonation as above. Tongue midline.  Motor: LUE: Inconsistent effort with max strength 3-4/5. Tone normal. No lag or bradykinesia with movements.  LLE: Inconsistent movement volitionally. Will move while distracted. On first attempt at passive elevation with release, patient maintained some effort. With subsequent attempt, leg fell to bed without any effort against gravity. Tone is normal. Moves ankle vigorously to noxious plantar stimulation but keeps remainder of leg still with evidence for increased tone in more proximal muscle groups during this maneuver.  RUE and RLE 5/5.  Sensory: As above Cerebellar: FNF normal on the right. With left FNF, patient was coached to try her best, resulting in very slow, tremulous movements of LUE with increased extensor and flexor tone while performing FNF and an effortful, pained affect. Patient was able to touch her finger to examiner's finger then to her nose over an approximately 15 second period without any drift during this maneuver.  Reflexes: 2+ and symmetric brachioradialis, biceps, patellae and achilles. Toes downgoing.  Gait: Deferred.   Results for orders placed or performed during the hospital encounter of 04/13/19 (from the past 48 hour(s))  CBG monitoring, ED     Status: Abnormal   Collection Time: 04/13/19  4:30 PM  Result Value Ref Range   Glucose-Capillary 128 (H) 70 - 99 mg/dL   No results found.  Assessment: 63 y.o. female with clinical presentation most consistent with watershed TIA involving the right hemisphere, versus nonphysiological weakness.  1. Supporting the former DDx was her severe drop in BP noted by EMS on arrival.  2. Supporting the latter DDx is giveway weakness and other inconsistencies on neurological exam  3. Stroke Risk Factors - DM and HTN 4. IV tPA not indicated due to  inconsistencies on exam suggestive of a presentation that was not secondary to ischemic stroke, with CTA/CTP to resolve the inconsistencies revealing no perfusion deficit or LVO.    Plan: 1. HgbA1c, fasting lipid panel 2. May not be able to perform MRI due to cardiac pacemaker  3. PT consult, OT consult, Speech consult 4. Echocardiogram 5. Telemetry monitoring 6. Prophylactic therapy- Start ASA. Continue atorvastatin 7. Risk factor modification 8. IVF with goal SBP of 120-140 9. Frequent neuro checks    @Electronically  signed: Dr. Kerney Elbe  04/13/2019, 4:37 PM

## 2019-04-13 NOTE — ED Triage Notes (Signed)
Pt arrives [facility] code stroke found on toilet towards the left. EMS reports left facial droop with left side arm and leg weakness.  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

## 2019-04-13 NOTE — ED Notes (Signed)
Attempted to call pt's daughter to give her room number. Did not answer

## 2019-04-13 NOTE — ED Notes (Signed)
Pt taken to CT.

## 2019-04-13 NOTE — ED Provider Notes (Signed)
[facility]ERG[facility] DEPARTMENT Provider Note   CSN: [id number] Arrival date & time: 04/13/19  2671  An emergency department physician performed an initial assessment on this suspected stroke patient at 1628.  History   Chief Complaint Chief Complaint  Patient presents with   Code Stroke    HPI Alexandra Ingram is a 63 y.o. female.      Extremity Weakness This is a recurrent problem. The current episode started less than 1 hour ago. The problem occurs constantly. The problem has been gradually improving. Pertinent negatives include no chest pain, no abdominal pain, no headaches and no shortness of breath. Nothing aggravates the symptoms. Nothing relieves the symptoms. She has tried nothing for the symptoms.  Patient was brought in by EMS due to concern for left upper and lower extremity weakness and hypotension.  Patient was found on her toilet at home minimally responsive and brought in by EMS.  By the time of her arrival here she is still not able to speak her name or her location.  Past Medical History:  Diagnosis Date   Allergic rhinitis    Coronary artery disease    CVA (cerebral vascular accident) ([place])    Diabetes mellitus without complication ([place])    Hyperlipidemia    Hypertension    Neuropathy    Seizures ([place])    Stroke [facility])    Thyroid disease     Patient Active Problem List   Diagnosis Date Noted   TIA (transient ischemic attack) 04/13/2019   Cardiomyopathy ([place]) 04/13/2019   History of stroke 04/13/2019   Normocytic normochromic anemia 04/13/2019   Essential hypertension 04/13/2019    Past Surgical History:  Procedure Laterality Date   APPENDECTOMY     BARIATRIC SURGERY     CARDIAC DEFIBRILLATOR PLACEMENT     CHOLECYSTECTOMY     TONSILLECTOMY       OB History   No obstetric history on file.      Home Medications    Prior to Admission medications   Medication Sig Start Date End Date Taking? Authorizing  Provider  atorvastatin (LIPITOR) 40 MG tablet Take 40 mg by mouth daily.   Yes [provider]  carvedilol (COREG) 12.5 MG tablet Take 12.5 mg by mouth 2 (two) times daily with a meal.   Yes [provider]  gabapentin (NEURONTIN) 300 MG capsule Take 300 mg by mouth 2 (two) times daily.   Yes [provider]  lisinopril (ZESTRIL) 5 MG tablet Take 5 mg by mouth daily.   Yes [provider]  mirabegron ER (MYRBETRIQ) 25 MG TB24 tablet Take 25 mg by mouth daily. 02/14/19 02/14/20 Yes [provider]  Semaglutide, 1 MG/DOSE, 2 MG/1.5ML SOPN Inject 1 mg into the skin every 7 (seven) days. Patient uses on Friday of each week   Yes [provider]  spironolactone (ALDACTONE) 25 MG tablet Take 25 mg by mouth daily. 11/22/18  Yes [provider]  tolterodine (DETROL LA) 2 MG 24 hr capsule Take 2 mg by mouth at bedtime. 10/07/18  Yes [provider]    Family History Family History  Problem Relation Age of Onset   Stroke Sister    Diabetes Mellitus II Sister    Stroke Brother     Social History Social History   Tobacco Use   Smoking status: Never Smoker   Smokeless tobacco: Never Used  Substance Use Topics   Alcohol use: Never    Frequency: Never   Drug use:  Never     Allergies   Codeine   Review of Systems Review of Systems  Constitutional: Negative for chills and fever.  HENT: Negative for ear pain and sore throat.   Eyes: Negative for pain and visual disturbance.  Respiratory: Negative for cough and shortness of breath.   Cardiovascular: Negative for chest pain and palpitations.  Gastrointestinal: Negative for abdominal pain and vomiting.  Genitourinary: Negative for dysuria and hematuria.  Musculoskeletal: Positive for extremity weakness. Negative for arthralgias and back pain.  Skin: Negative for color change and rash.  Neurological: Positive for weakness. Negative for seizures and headaches.  All  other systems reviewed and are negative.    Physical Exam Updated Vital Signs BP 124/79    Pulse 74    Temp 97.7 F (36.5 C)    Resp 20    Ht  (1.575 m)    Wt 89.1 kg    SpO2 96%    BMI 35.93 kg/m   Physical Exam Vitals signs and nursing note reviewed.  Constitutional:      General: She is not in acute distress.    Appearance: She is well-developed.  HENT:     Head: Normocephalic and atraumatic.  Eyes:     Conjunctiva/sclera: Conjunctivae normal.  Neck:     Musculoskeletal: Neck supple.  Cardiovascular:     Rate and Rhythm: Normal rate and regular rhythm.     Heart sounds: No murmur.  Pulmonary:     Effort: Pulmonary effort is normal. No respiratory distress.     Breath sounds: Normal breath sounds.  Abdominal:     Palpations: Abdomen is soft.     Tenderness: There is no abdominal tenderness.  Skin:    General: Skin is warm and dry.  Neurological:     Mental Status: She is alert.      ED Treatments / Results  Labs (all labs ordered are listed, but only abnormal results are displayed) Labs Reviewed  CBC - Abnormal; Notable for the following components:      Result Value   Hemoglobin 10.1 (*)    HCT 32.9 (*)    MCV 73.4 (*)    MCH 22.5 (*)    All other components within normal limits  COMPREHENSIVE METABOLIC PANEL - Abnormal; Notable for the following components:   Glucose, Bld 137 (*)    Creatinine, Ser 1.36 (*)    Total Protein 6.2 (*)    GFR calc non Af Amer 42 (*)    GFR calc Af Amer 49 (*)    All other components within normal limits  HEMOGLOBIN A1C - Abnormal; Notable for the following components:   Hgb A1c MFr Bld 6.0 (*)    All other components within normal limits  CBC - Abnormal; Notable for the following components:   Hemoglobin 10.5 (*)    HCT 33.2 (*)    MCV 72.2 (*)    MCH 22.8 (*)    RDW 15.7 (*)    All other components within normal limits  CREATININE, SERUM - Abnormal; Notable for the following components:   Creatinine, Ser 1.14  (*)    GFR calc non Af Amer 52 (*)    All other components within normal limits  I-STAT CHEM 8, ED - Abnormal; Notable for the following components:   BUN 23 (*)    Creatinine, Ser 1.30 (*)    Glucose, Bld 130 (*)    Hemoglobin 11.6 (*)    HCT 34.0 (*)  All other components within normal limits  CBG MONITORING, ED - Abnormal; Notable for the following components:   Glucose-Capillary 128 (*)    All other components within normal limits  SARS CORONAVIRUS 2 (TAT 6-24 HRS)  ETHANOL  DIFFERENTIAL  APTT  PROTIME-INR  LACTIC ACID, PLASMA  RAPID URINE DRUG SCREEN, HOSP PERFORMED  URINALYSIS, ROUTINE W REFLEX MICROSCOPIC  HIV ANTIBODY (ROUTINE TESTING W REFLEX)  HEMOGLOBIN A1C  LIPID PANEL  COMPREHENSIVE METABOLIC PANEL  CBC  I-STAT BETA HCG BLOOD, ED ([facility], [facility], [facility] ONLY)  TROPONIN I (HIGH SENSITIVITY)  TROPONIN I (HIGH SENSITIVITY)    EKG EKG Interpretation  Date/Time:  Thursday April 13 2019 17:03:15 EDT Ventricular Rate:  90 PR Interval:    QRS Duration: 94 QT Interval:  476 QTC Calculation: 583 R Axis:   72 Text Interpretation:  Sinus rhythm Low voltage, precordial leads Borderline T abnormalities, anterior leads Prolonged QT interval No previous ECGs available Confirmed by Alvira MondaySchlossman, Erin ([id number]) on 04/13/2019 8:24:05 PM   Radiology Ct Code Stroke Cta Head W/wo Contrast  Result Date: 04/13/2019 CLINICAL DATA:  Stroke.  Left-sided weakness and facial droop EXAM: CT ANGIOGRAPHY HEAD AND NECK CT PERFUSION BRAIN TECHNIQUE: Multidetector CT imaging of the head and neck was performed using the standard protocol during bolus administration of intravenous contrast. Multiplanar CT image reconstructions and MIPs were obtained to evaluate the vascular anatomy. Carotid stenosis measurements (when applicable) are obtained utilizing NASCET criteria, using the distal internal carotid diameter as the denominator. Multiphase CT imaging of the brain was performed following IV bolus  contrast injection. Subsequent parametric perfusion maps were calculated using RAPID software. CONTRAST:  100mL OMNIPAQUE IOHEXOL 350 MG/ML SOLN COMPARISON:  CT head 04/13/2019 FINDINGS: CTA NECK FINDINGS Aortic arch: Standard branching. Imaged portion shows no evidence of aneurysm or dissection. No significant stenosis of the major arch vessel origins. Right carotid system: No evidence of dissection, right carotid widely patent. Minimal calcification right carotid bulb without stenosis Left carotid system: Left carotid widely patent without stenosis. No significant atherosclerotic disease. Vertebral arteries: Calcific plaque at the origin of the left vertebral artery with mild-to-moderate stenosis. Remainder of the left vertebral artery is patent to the basilar. Right vertebral artery widely patent without stenosis. Skeleton: Cervical spondylosis.  No acute skeletal abnormality. Other neck: Negative for mass or adenopathy in the neck. Upper chest: Lung apices clear bilaterally. Review of the MIP images confirms the above findings CTA HEAD FINDINGS Anterior circulation: Cavernous carotid widely patent bilaterally without stenosis. Anterior and middle cerebral arteries patent bilaterally without stenosis or large vessel occlusion Posterior circulation: Both vertebral arteries widely patent to the basilar. PICA not visualized. AICA patent bilaterally basilar widely patent. Superior cerebellar and posterior cerebral arteries patent bilaterally. Venous sinuses: Normal venous enhancement Anatomic variants: None Review of the MIP images confirms the above findings CT Brain Perfusion Findings: ASPECTS: 10 CBF (<30%) Volume: 0mL Perfusion (Tmax>6.0s) volume: 0mL Mismatch Volume: 0mL Infarction Location:None IMPRESSION: 1. CT perfusion negative for acute infarct or ischemia 2. Negative for emergent large vessel intracranial occlusion 3. No significant carotid stenosis. Mild to moderate stenosis origin of left vertebral  artery. Electronically Signed   By: Marlan Palauharles  Clark M.D.   On: 04/13/2019 17:20   Ct Head Wo Contrast  Result Date: 04/13/2019 CLINICAL DATA:  Evaluate for infarct. EXAM: CT HEAD WITHOUT CONTRAST TECHNIQUE: Contiguous axial images were obtained from the base of the skull through the vertex without intravenous contrast. COMPARISON:  Earlier today FINDINGS: Brain: No evidence of acute  infarction, hemorrhage, hydrocephalus, extra-axial collection or mass lesion/mass effect. Vascular: No hyperdense vessel or unexpected calcification. Skull: Normal. Negative for fracture or focal lesion. Sinuses/Orbits: No acute finding. Other: None IMPRESSION: No acute intracranial abnormalities. Normal brain. Electronically Signed   By: Signa Kell M.D.   On: 04/13/2019 19:41   Ct Code Stroke Cta Neck W/wo Contrast  Result Date: 04/13/2019 CLINICAL DATA:  Stroke.  Left-sided weakness and facial droop EXAM: CT ANGIOGRAPHY HEAD AND NECK CT PERFUSION BRAIN TECHNIQUE: Multidetector CT imaging of the head and neck was performed using the standard protocol during bolus administration of intravenous contrast. Multiplanar CT image reconstructions and MIPs were obtained to evaluate the vascular anatomy. Carotid stenosis measurements (when applicable) are obtained utilizing NASCET criteria, using the distal internal carotid diameter as the denominator. Multiphase CT imaging of the brain was performed following IV bolus contrast injection. Subsequent parametric perfusion maps were calculated using RAPID software. CONTRAST:  OMNIPAQUE IOHEXOL 350 MG/ML SOLN COMPARISON:  CT head 04/13/2019 FINDINGS: CTA NECK FINDINGS Aortic arch: Standard branching. Imaged portion shows no evidence of aneurysm or dissection. No significant stenosis of the major arch vessel origins. Right carotid system: No evidence of dissection, right carotid widely patent. Minimal calcification right carotid bulb without stenosis Left carotid system: Left  carotid widely patent without stenosis. No significant atherosclerotic disease. Vertebral arteries: Calcific plaque at the origin of the left vertebral artery with mild-to-moderate stenosis. Remainder of the left vertebral artery is patent to the basilar. Right vertebral artery widely patent without stenosis. Skeleton: Cervical spondylosis.  No acute skeletal abnormality. Other neck: Negative for mass or adenopathy in the neck. Upper chest: Lung apices clear bilaterally. Review of the MIP images confirms the above findings CTA HEAD FINDINGS Anterior circulation: Cavernous carotid widely patent bilaterally without stenosis. Anterior and middle cerebral arteries patent bilaterally without stenosis or large vessel occlusion Posterior circulation: Both vertebral arteries widely patent to the basilar. PICA not visualized. AICA patent bilaterally basilar widely patent. Superior cerebellar and posterior cerebral arteries patent bilaterally. Venous sinuses: Normal venous enhancement Anatomic variants: None Review of the MIP images confirms the above findings CT Brain Perfusion Findings: ASPECTS: 10 CBF (<30%) Volume: 0mL Perfusion (Tmax>6.0s) volume: 0mL Mismatch Volume: 0mL Infarction Location:None IMPRESSION: 1. CT perfusion negative for acute infarct or ischemia 2. Negative for emergent large vessel intracranial occlusion 3. No significant carotid stenosis. Mild to moderate stenosis origin of left vertebral artery. Electronically Signed   By: Marlan Palau M.D.   On: 04/13/2019 17:20   Ct Code Stroke Cta Cerebral Perfusion W/wo Contrast  Result Date: 04/13/2019 CLINICAL DATA:  Stroke.  Left-sided weakness and facial droop EXAM: CT ANGIOGRAPHY HEAD AND NECK CT PERFUSION BRAIN TECHNIQUE: Multidetector CT imaging of the head and neck was performed using the standard protocol during bolus administration of intravenous contrast. Multiplanar CT image reconstructions and MIPs were obtained to evaluate the vascular  anatomy. Carotid stenosis measurements (when applicable) are obtained utilizing NASCET criteria, using the distal internal carotid diameter as the denominator. Multiphase CT imaging of the brain was performed following IV bolus contrast injection. Subsequent parametric perfusion maps were calculated using RAPID software. CONTRAST:  OMNIPAQUE IOHEXOL 350 MG/ML SOLN COMPARISON:  CT head 04/13/2019 FINDINGS: CTA NECK FINDINGS Aortic arch: Standard branching. Imaged portion shows no evidence of aneurysm or dissection. No significant stenosis of the major arch vessel origins. Right carotid system: No evidence of dissection, right carotid widely patent. Minimal calcification right carotid bulb without stenosis Left carotid system: Left carotid  widely patent without stenosis. No significant atherosclerotic disease. Vertebral arteries: Calcific plaque at the origin of the left vertebral artery with mild-to-moderate stenosis. Remainder of the left vertebral artery is patent to the basilar. Right vertebral artery widely patent without stenosis. Skeleton: Cervical spondylosis.  No acute skeletal abnormality. Other neck: Negative for mass or adenopathy in the neck. Upper chest: Lung apices clear bilaterally. Review of the MIP images confirms the above findings CTA HEAD FINDINGS Anterior circulation: Cavernous carotid widely patent bilaterally without stenosis. Anterior and middle cerebral arteries patent bilaterally without stenosis or large vessel occlusion Posterior circulation: Both vertebral arteries widely patent to the basilar. PICA not visualized. AICA patent bilaterally basilar widely patent. Superior cerebellar and posterior cerebral arteries patent bilaterally. Venous sinuses: Normal venous enhancement Anatomic variants: None Review of the MIP images confirms the above findings CT Brain Perfusion Findings: ASPECTS: 10 CBF (<30%) Volume: 30mL Perfusion (Tmax>6.0s) volume: 29mL Mismatch Volume: 72mL Infarction  Location:None IMPRESSION: 1. CT perfusion negative for acute infarct or ischemia 2. Negative for emergent large vessel intracranial occlusion 3. No significant carotid stenosis. Mild to moderate stenosis origin of left vertebral artery. Electronically Signed   By: Marlan Palau M.D.   On: 04/13/2019 17:20   Ct Angio Chest/abd/pel For Dissection W And/or Wo Contrast  Result Date: 04/13/2019 CLINICAL DATA:  Chest and back pain, question of aortic dissection EXAM: CT ANGIOGRAPHY CHEST, ABDOMEN AND PELVIS TECHNIQUE: Multidetector CT imaging through the chest, abdomen and pelvis was performed using the standard protocol during bolus administration of intravenous contrast. Multiplanar reconstructed images and MIPs were obtained and reviewed to evaluate the vascular anatomy. CONTRAST:  25mL OMNIPAQUE IOHEXOL 350 MG/ML SOLN COMPARISON:  None. FINDINGS: CTA CHEST FINDINGS Cardiovascular: --Heart: The heart size is normal.  There is nopericardial effusion. --Aorta: The course and caliber of the thoracic aorta are normal. There is mild scattered aortic atherosclerotic calcification. Precontrast images show no aortic intramural hematoma. There is no blood pool, dissection or penetrating ulcer demonstrated on arterial phase postcontrast imaging. There is a conventional 3 vessel aortic arch branching pattern. The proximal arch vessels are widely patent. --Pulmonary Arteries: Contrast timing is optimized for preferential opacification of the aorta. Within that limitation, normal central pulmonary arteries. Mediastinum/Nodes: A left-sided pacemaker seen with the lead tips in the right ventricle. No mediastinal, hilar or axillary lymphadenopathy. The visualized thyroid and thoracic esophageal course are unremarkable. Lungs/Pleura: No pulmonary nodules or masses. No pleural effusion or pneumothorax. No focal airspace consolidation. No focal pleural abnormality. Musculoskeletal: No chest wall abnormality. No acute osseous  findings. Review of the MIP images confirms the above findings. CTA ABDOMEN AND PELVIS FINDINGS VASCULAR Aorta: Normal caliber aorta without aneurysm, dissection, vasculitis or hemodynamically significant stenosis. There is mild aortic atherosclerosis. Celiac: No aneurysm, dissection or hemodynamically significant stenosis. Normal branching pattern SMA: Widely patent without dissection or stenosis. Renals: Single renal arteries bilaterally. No aneurysm, dissection, stenosis or evidence of fibromuscular dysplasia. IMA: Patent without abnormality. Inflow: No aneurysm, stenosis or dissection. Veins: Normal course and caliber of the major veins. Assessment is otherwise limited by the arterial dominant contrast phase. Review of the MIP images confirms the above findings. NON-VASCULAR Hepatobiliary: Normal hepatic contours and density. No visible biliary dilatation. The patient is status post cholecystectomy. No biliary ductal dilation. Pancreas: Normal contours without ductal dilatation. No peripancreatic fluid collection. Spleen: Normal arterial phase splenic enhancement pattern. Adrenals/Urinary Tract: --Adrenal glands: Normal. --Right kidney/ureter: Multiple scattered renal calculi are noted, less than 1 cm. No hydronephrosis however is seen. --  Left kidney/ureter: Multiple scattered renal calculi are noted, less than 1 cm. No hydronephrosis. There is a low-density 1 cm lesion seen in the upper pole the left kidney. --Urinary bladder: Unremarkable. Stomach/Bowel: --Stomach/Duodenum: A small hiatal hernia is present. The patient has had prior gastric sleeve. --Small bowel: No dilatation or inflammation. --Colon: Scattered colonic diverticula are noted without diverticulitis. --Appendix: Normal. Lymphatic:  No abdominal or pelvic lymphadenopathy. Reproductive: No free fluid in the pelvis. Musculoskeletal. No bony spinal canal stenosis or focal osseous abnormality. Degenerative changes are seen in the lower thoracic  spine. Other: None. Review of the MIP images confirms the above findings. IMPRESSION: 1. No acute aortic abnormality. 2. Mild aortic Atherosclerosis (ICD10-I70.0). 3. Multiple bilateral subcentimeter nonobstructing renal calculi. 4. No acute intrathoracic pathology. Electronically Signed   By: Jonna Clark M.D.   On: 04/13/2019 21:54   Ct Head Code Stroke Wo Contrast  Result Date: 04/13/2019 CLINICAL DATA:  Code stroke.  Left-sided weakness and facial droop. EXAM: CT HEAD WITHOUT CONTRAST TECHNIQUE: Contiguous axial images were obtained from the base of the skull through the vertex without intravenous contrast. COMPARISON:  None. FINDINGS: Brain: Normal appearance of brain parenchyma without evidence of old or acute infarction, mass lesion, hemorrhage, hydrocephalus or extra-axial collection. Vascular: There is atherosclerotic calcification of the major vessels at the base of the brain. Skull: Normal Sinuses/Orbits: Clear Other: None ASPECTS ([place] Stroke Program Early CT Score) - Ganglionic level infarction (caudate, lentiform nuclei, internal capsule, insula, M1-M3 cortex): 7 - Supraganglionic infarction (M4-M6 cortex): 3 Total score (0-10 with 10 being normal): 10 IMPRESSION: 1. Normal appearance of the brain parenchyma. No sign of old or acute infarction. There is atherosclerotic calcification of the major vessels at the base of the brain. 2. ASPECTS is 10 3. These results were communicated to Dr. Otelia Limes at 4:44 pmon 10/22/2020by text page via the [facility] messaging system. Electronically Signed   By: Paulina Fusi M.D.   On: 04/13/2019 16:45    Procedures Procedures (including critical care time)  Medications Ordered in ED Medications  0.9 %  sodium chloride infusion ( Intravenous New Bag/Given 04/14/19 0046)  acetaminophen (TYLENOL) tablet 650 mg (has no administration in time range)    Or  acetaminophen (TYLENOL) solution 650 mg (has no administration in time range)    Or  acetaminophen  (TYLENOL) suppository 650 mg (has no administration in time range)  enoxaparin (LOVENOX) injection 40 mg (has no administration in time range)  aspirin suppository 300 mg (has no administration in time range)    Or  aspirin tablet 325 mg (has no administration in time range)  insulin aspart (novoLOG) injection 0-9 Units (0 Units Subcutaneous Not Given 04/14/19 0047)  iohexol (OMNIPAQUE) 350 MG/ML injection 100 mL (100 mLs Intravenous Contrast Given 04/13/19 1700)  iohexol (OMNIPAQUE) 350 MG/ML injection 80 mL (80 mLs Intravenous Contrast Given 04/13/19 2128)   stroke: mapping our early stages of recovery book ( Does not apply Given 04/14/19 0047)     Initial Impression / Assessment and Plan / ED Course  I have reviewed the triage vital signs and the nursing notes.  Pertinent labs & imaging results that were available during my care of the patient were reviewed by me and considered in my medical decision making (see chart for details).        Patient is a 63 year old female with history of physical exam as above presents to the emergency department for evaluation of a prehospital activated code stroke for her left sided weakness.  Neurology was immediately available to evaluate the patient in the emergency department.  Patient was taken back to the CT scanner where code stroke imaging was obtained which demonstrated no emergent findings.  Patient continued to improve while in the emergency department.  Her exam went from left upper and lower extremity weakness without being able to move either her left upper or lower extremity to be able to speak and answer questions as well as move her left upper and lower extremity.  Strength 3-5 left upper extremity and lower extremity.  Strength 5 out of 5 in right upper and lower extremities.  No facial droop noted.  No slurring of speech noted.  Patient is slow to respond but does so without significant difficulty.  No aphasia noted.  Neurology was  consulted to evaluate this patient.  Please see their note for further details of assessment and plan.  Patient was admitted to the hospitalist service ultimately for further work-up and management of possible TIA.  Patient was seen in conjunction with the attending physician of record for this patient..  Final Clinical Impressions(s) / ED Diagnoses   Final diagnoses:  None    ED Discharge Orders    None       Jonna Clark, MD 04/14/19 5409    Alvira Monday, MD 04/15/19 1214

## 2019-04-13 NOTE — ED Notes (Signed)
Report given to 3W RN. All questions answered.  

## 2019-04-14 ENCOUNTER — Observation Stay (HOSPITAL_COMMUNITY): Payer: Medicare HMO

## 2019-04-14 ENCOUNTER — Observation Stay (HOSPITAL_BASED_OUTPATIENT_CLINIC_OR_DEPARTMENT_OTHER): Payer: Medicare HMO

## 2019-04-14 DIAGNOSIS — R55 Syncope and collapse: Secondary | ICD-10-CM | POA: Diagnosis not present

## 2019-04-14 DIAGNOSIS — I361 Nonrheumatic tricuspid (valve) insufficiency: Secondary | ICD-10-CM

## 2019-04-14 DIAGNOSIS — I10 Essential (primary) hypertension: Secondary | ICD-10-CM | POA: Diagnosis not present

## 2019-04-14 DIAGNOSIS — Z8673 Personal history of transient ischemic attack (TIA), and cerebral infarction without residual deficits: Secondary | ICD-10-CM

## 2019-04-14 LAB — CBC
HCT: 30.5 % — ABNORMAL LOW (ref 36.0–46.0)
Hemoglobin: 9.8 g/dL — ABNORMAL LOW (ref 12.0–15.0)
MCH: 22.8 pg — ABNORMAL LOW (ref 26.0–34.0)
MCHC: 32.1 g/dL (ref 30.0–36.0)
MCV: 70.9 fL — ABNORMAL LOW (ref 80.0–100.0)
Platelets: 156 10*3/uL (ref 150–400)
RBC: 4.3 MIL/uL (ref 3.87–5.11)
RDW: 15.5 % (ref 11.5–15.5)
WBC: 7.1 10*3/uL (ref 4.0–10.5)
nRBC: 0 % (ref 0.0–0.2)

## 2019-04-14 LAB — COMPREHENSIVE METABOLIC PANEL
ALT: 26 U/L (ref 0–44)
AST: 25 U/L (ref 15–41)
Albumin: 3.3 g/dL — ABNORMAL LOW (ref 3.5–5.0)
Alkaline Phosphatase: 105 U/L (ref 38–126)
Anion gap: 9 (ref 5–15)
BUN: 16 mg/dL (ref 6–20)
CO2: 22 mmol/L (ref 22–32)
Calcium: 8.9 mg/dL (ref 8.9–10.3)
Chloride: 109 mmol/L (ref 98–111)
Creatinine, Ser: 1.02 mg/dL — ABNORMAL HIGH (ref 0.44–1.00)
GFR calc Af Amer: >60 mL/min (ref >60)
GFR calc non Af Amer: 60 mL/min — ABNORMAL LOW (ref >60)
Glucose, Bld: 84 mg/dL (ref 70–99)
Potassium: 3.4 mmol/L — ABNORMAL LOW (ref 3.5–5.1)
Sodium: 140 mmol/L (ref 135–145)
Total Bilirubin: 0.6 mg/dL (ref 0.3–1.2)
Total Protein: 6 g/dL — ABNORMAL LOW (ref 6.5–8.1)

## 2019-04-14 LAB — HEMOGLOBIN A1C
Hgb A1c MFr Bld: 6 % — ABNORMAL HIGH (ref 4.8–5.6)
Mean Plasma Glucose: 125.5 mg/dL

## 2019-04-14 LAB — HIV ANTIBODY (ROUTINE TESTING W REFLEX): HIV Screen 4th Generation wRfx: NONREACTIVE

## 2019-04-14 LAB — CREATININE, SERUM
Creatinine, Ser: 1.14 mg/dL — ABNORMAL HIGH (ref 0.44–1.00)
GFR calc Af Amer: >60 mL/min (ref >60)
GFR calc non Af Amer: 52 mL/min — ABNORMAL LOW (ref >60)

## 2019-04-14 LAB — LIPID PANEL
Cholesterol: 88 mg/dL (ref 0–200)
HDL: 43 mg/dL (ref >40)
LDL Cholesterol: 36 mg/dL (ref 0–99)
Total CHOL/HDL Ratio: 2 RATIO
Triglycerides: 47 mg/dL (ref <150)
VLDL: 9 mg/dL (ref 0–40)

## 2019-04-14 LAB — ECHOCARDIOGRAM COMPLETE
Height: 62 in
Weight: 3142.88 oz

## 2019-04-14 LAB — SARS CORONAVIRUS 2 (TAT 6-24 HRS): SARS Coronavirus 2: NEGATIVE

## 2019-04-14 LAB — GLUCOSE, CAPILLARY
Glucose-Capillary: 92 mg/dL (ref 70–99)
Glucose-Capillary: 108 mg/dL — ABNORMAL HIGH (ref 70–99)

## 2019-04-14 LAB — TROPONIN I (HIGH SENSITIVITY): Troponin I (High Sensitivity): 4 ng/L (ref <18)

## 2019-04-14 MED ORDER — ATORVASTATIN CALCIUM 10 MG PO TABS: 20.0000 mg | ORAL_TABLET | Freq: Every day | ORAL | Status: DC

## 2019-04-14 MED ORDER — ASPIRIN 325 MG PO TBEC: 325.0000 mg | DELAYED_RELEASE_TABLET | Freq: Every day | ORAL | 0 refills | Status: DC

## 2019-04-14 MED ORDER — ATORVASTATIN CALCIUM 20 MG PO TABS: 20.0000 mg | ORAL_TABLET | Freq: Every day | ORAL | 0 refills | Status: AC

## 2019-04-14 MED ORDER — ASPIRIN EC 325 MG PO TBEC: 325.0000 mg | DELAYED_RELEASE_TABLET | Freq: Every day | ORAL | Status: DC

## 2019-04-14 NOTE — TOC Transition Note (Signed)
Transition of Care [facility]) - CM/SW Discharge Note   Patient Details  Name: Alexandra Ingram MRN: [id number] Date of Birth: [birth year]  Transition of Care [facility]) CM/SW Contact:  Pollie Friar, RN Phone Number: 04/14/2019, 2:21 PM   Clinical Narrative:    Pt is discharging home with [facility] services through [facility]. [name] with [facility] accepted the referral. [facility] to deliver 3 in 1 to patients room. Pt has transportation home.   Final next level of care: Home w Home Health Services Barriers to Discharge: No Barriers Identified   Patient Goals and CMS Choice   CMS Medicare.gov Compare Post Acute Care list provided to:: Patient Choice offered to / list presented to : Patient  Discharge Placement                       Discharge Plan and Services   Discharge Planning Services: CM Consult Post Acute Care Choice: Home Health, Durable Medical Equipment          DME Arranged: 3-N-1 DME Agency: [facility] Date DME Agency Contacted: 04/14/19   Representative spoke with at DME Agency: [place]: PT, OT [place] Agency: [place] Date [place]: 04/14/19   Representative spoke with at [place]: [place] ([place]) Interventions     Readmission Risk Interventions No flowsheet data found.

## 2019-04-14 NOTE — Evaluation (Signed)
Physical Therapy Evaluation Patient Details Name: Alexandra Ingram MRN: [id number] DOB: 1956/06/02 Today's Date: 04/14/2019   History of Present Illness  63 y.o. female admitted on 04/13/19 for increased L sided weakness and facial droop.  Pt CT negative for large vessel occlusion, unable to do MRI due to AICD.  Pt with significant PMH of stroke, thyroid disease, seizure, neuropathy, HTN, DM, CAD, bariatric surgery.     Clinical Impression  Pt with some functional weakness on her left during gait, making her gait pattern a bit unsteady and her speed slower than usual.  She did not require much assist and has 24/7 family at home.  She would benefit from [facility] therapy follow up to help her return to baseline faster as she wants to be able to care for her 2 y.o. granddaughter again.  PT will continue to follow acutely for safe mobility progression.  Try stairs if still here tomorrow.      Follow Up Recommendations Home health PT    Equipment Recommendations  None recommended by PT    Recommendations for Other Services   NA     Precautions / Restrictions Precautions Precautions: Fall Precaution Comments: left sided weakness.       Mobility  Bed Mobility Overal bed mobility: Modified Independent Bed Mobility: Sit to Supine     Supine to sit: Min guard Sit to supine: Modified independent (Device/Increase time)   General bed mobility comments: extra time needed and use of railing, but able to lift legs in given extra time and effort.   Transfers Overall transfer level: Needs assistance Equipment used: 1 person hand held assist Transfers: Sit to/from Stand Sit to Stand: Min assist Stand pivot transfers: Min assist       General transfer comment: Min assist to help pt power up and stabilize  Ambulation/Gait Ambulation/Gait assistance: Min guard Gait Distance (Feet): 100 Feet Assistive device: 1 person hand held assist Gait Pattern/deviations: Step-through pattern;Decreased step  length - left;Decreased dorsiflexion - left;Shuffle Gait velocity: decreased Gait velocity interpretation: <1.8 ft/sec, indicate of risk for recurrent falls General Gait Details: slow, choppy gait pattern, with functional L LE weakness, mild L LE drag  Stairs Stairs: Yes       General stair comments: Verbally discussed stairs, did not practice today.  Encouraged up with her stronger R LE and step to pattern.      Modified Rankin (Stroke Patients Only) Modified Rankin (Stroke Patients Only) Pre-Morbid Rankin Score: No significant disability Modified Rankin: Moderately severe disability     Balance Overall balance assessment: Needs assistance Sitting-balance support: Feet supported;No upper extremity supported Sitting balance-Leahy Scale: Good     Standing balance support: Single extremity supported Standing balance-Leahy Scale: Fair Standing balance comment: once standing pt can statically stand with supervision, however, anything dynamic requires min or min guard assist and one UE support.                              Pertinent Vitals/Pain Pain Assessment: Faces Faces Pain Scale: Hurts even more Pain Location: headache Pain Descriptors / Indicators: Aching Pain Intervention(s): Limited activity within patient's tolerance;Monitored during session;Repositioned    Home Living Family/patient expects to be discharged to:: Private residence Living Arrangements: Children;Other relatives(daughter and son in law, 2 grandchildren (2 and 7)) Available Help at Discharge: Family;Available 24 hours/day Type of Home: House Home Access: Level entry     Home Layout: Two level;Able to live on main level with  bedroom/bathroom Home Equipment: None Additional Comments: cares for the 63 y.o. during the day    Prior Function Level of Independence: Independent               Hand Dominance   Dominant Hand: Right    Extremity/Trunk Assessment   Upper Extremity  Assessment Upper Extremity Assessment: Defer to OT evaluation LUE Deficits / Details: reports numbness in hand but able to use functionally this session with increased time LUE Sensation: WNL LUE Coordination: WNL    Lower Extremity Assessment Lower Extremity Assessment: LLE deficits/detail LLE Deficits / Details: left leg is weaker than right leg.  Strength is 3-/5 throughout.  Sensation is deminished on this side, but still able to localize.   LLE Sensation: decreased light touch(but still able to localize, just more numb than R Le)    Cervical / Trunk Assessment Cervical / Trunk Assessment: Normal  Communication   Communication: No difficulties  Cognition Arousal/Alertness: Awake/alert Behavior During Therapy: WFL for tasks assessed/performed Overall Cognitive Status: Within Functional Limits for tasks assessed                                 General Comments: not specifically tested, but hx matched OT's and conversation normal.       General Comments General comments (skin integrity, edema, etc.): limited session due to ECHO waiting to see pt, so repoisitioned in bed at end of session.  Asked pt to call RN staff to make sure she can get back up from lunch.         Assessment/Plan    PT Assessment Patient needs continued PT services  PT Problem List Decreased strength;Decreased activity tolerance;Decreased balance;Decreased mobility;Decreased knowledge of use of DME;Decreased knowledge of precautions;Impaired sensation       PT Treatment Interventions DME instruction;Gait training;Functional mobility training;Stair training;Therapeutic activities;Therapeutic exercise;Balance training;Patient/family education    PT Goals (Current goals can be found in the Care Plan section)  Acute Rehab PT Goals Patient Stated Goal: to be able to care for her 2 y.o. granddaughter again PT Goal Formulation: With patient Time For Goal Achievement: 04/28/19 Potential to  Achieve Goals: Good    Frequency Min 4X/week           AM-PAC PT "6 Clicks" Mobility  Outcome Measure Help needed turning from your back to your side while in a flat bed without using bedrails?: A Little Help needed moving from lying on your back to sitting on the side of a flat bed without using bedrails?: A Little Help needed moving to and from a bed to a chair (including a wheelchair)?: A Little Help needed standing up from a chair using your arms (e.g., wheelchair or bedside chair)?: A Little Help needed to walk in hospital room?: A Little Help needed climbing 3-5 steps with a railing? : A Little 6 Click Score: 18    End of Session Equipment Utilized During Treatment: Gait belt Activity Tolerance: Patient tolerated treatment well Patient left: in bed;Other (comment)(with ECHO tech in room)   PT Visit Diagnosis: Muscle weakness (generalized) (M62.81);Difficulty in walking, not elsewhere classified (R26.2);Hemiplegia and hemiparesis Hemiplegia - Right/Left: Left Hemiplegia - dominant/non-dominant: Non-dominant Hemiplegia - caused by: Cerebral infarction    Time: 2542-7062 PT Time Calculation (min) (ACUTE ONLY): 14 min   Charges:      Wells Guiles B. Aydrien Froman, PT, DPT  Acute Rehabilitation [phone] pager [phone] office  @ [place]: (  [phone]   PT Evaluation $PT Eval Moderate Complexity: 1 Mod        04/14/2019, 12:09 PM

## 2019-04-14 NOTE — Progress Notes (Signed)
Patient discharged, waiting on  family

## 2019-04-14 NOTE — Evaluation (Signed)
Occupational Therapy Evaluation Patient Details Name: Alexandra Ingram MRN: [id number] DOB: 26-Nov-1955 Today's Date: 04/14/2019    History of Present Illness 63 y.o. female with previous history of stroke with left-sided weakness, cardiomyopathy status post AICD, hypertension usually follows at [facility] is presently living at patient's daughter's house last few months at around 4 PM started having difficulty getting up from the commode and EMS was called.  Patient was noticed to have left facial droop and left-sided weakness and was brought to the ER.  Patient also had some speech difficulties. Testing to rule out CVA vs. TIA   Clinical Impression   Patient presenting with decreased I in self care, balance, functional mobility, transfers, endurance, and safety awareness. Patient reports being independent and living with daughter and son in law PTA. Patient currently functioning at min A overall without use of AD. Patient will benefit from acute OT to increase overall independence in the areas of ADLs, functional mobility, and safety awareness in order to safely discharge home with caregivers    Follow Up Recommendations  Home health OT;Supervision/Assistance - 24 hour     Equipment Recommendations   3 in 1 bedside commode       Precautions / Restrictions Precautions Precautions: Fall      Mobility Bed Mobility Overal bed mobility: Needs Assistance Bed Mobility: Supine to Sit     Supine to sit: Min guard     General bed mobility comments: min cuing for hand placement  Transfers Overall transfer level: Needs assistance Equipment used: 1 person hand held assist Transfers: Sit to/from [facility] Sit to Stand: Min assist Stand pivot transfers: Min assist       General transfer comment: min A progressing to steady assistance without use of AD    Balance Overall balance assessment: Needs assistance Sitting-balance support: Feet  supported Sitting balance-Leahy Scale: Good     Standing balance support: No upper extremity supported;During functional activity Standing balance-Leahy Scale: Fair Standing balance comment: min hand held assistance from therapist        ADL either performed or assessed with clinical judgement   ADL Overall ADL's : Needs assistance/impaired     Grooming: Wash/dry hands;Wash/dry face;Oral care;Standing;Min guard Grooming Details (indicate cue type and reason): min guard for balance Upper Body Bathing: Set up;Sitting   Lower Body Bathing: Minimal assistance;Sit to/from stand   Upper Body Dressing : Set up;Sitting   Lower Body Dressing: Minimal assistance;Sit to/from stand   Toilet Transfer: Minimal Office manager and Hygiene: Minimal assistance         General ADL Comments: min A for balance and functional transfers/ambulation without use of AD     Vision Baseline Vision/History: Wears glasses Wears Glasses: Distance only Patient Visual Report: No change from baseline Vision Assessment?: No apparent visual deficits            Pertinent Vitals/Pain Pain Assessment: No/denies pain     Hand Dominance Right   Extremity/Trunk Assessment Upper Extremity Assessment Upper Extremity Assessment: LUE deficits/detail LUE Deficits / Details: reports numbness in hand but able to use functionally this session with increased time LUE Sensation: WNL LUE Coordination: WNL   Lower Extremity Assessment Lower Extremity Assessment: Defer to PT evaluation   Cervical / Trunk Assessment Cervical / Trunk Assessment: Normal   Communication Communication Communication: No difficulties   Cognition Arousal/Alertness: Awake/alert Behavior During Therapy: WFL for tasks assessed/performed Overall Cognitive Status: Within Functional Limits for tasks assessed  Home Living Family/patient expects to be  discharged to:: Private residence Living Arrangements: Children(daughter and son in law) Available Help at Discharge: Family;Available 24 hours/day Type of Home: House Home Access: Level entry     Home Layout: Two level;Able to live on main level with bedroom/bathroom Alternate Level Stairs-Number of Steps: pt's room is upstairs - 14 steps   Bathroom Shower/Tub: Estate manager/land agent Accessibility: Yes How Accessible: Accessible via walker Home Equipment: None          Prior Functioning/Environment Level of Independence: Independent                 OT Problem List: Decreased strength;Impaired sensation;Decreased activity tolerance;Decreased safety awareness;Impaired balance (sitting and/or standing)      OT Treatment/Interventions: Self-care/ADL training;Therapeutic exercise;Therapeutic activities;Cognitive remediation/compensation;Energy conservation;Patient/family education;Balance training;Modalities    OT Goals(Current goals can be found in the care plan section) Acute Rehab OT Goals Patient Stated Goal: to go home OT Goal Formulation: With patient Time For Goal Achievement: 04/28/19 Potential to Achieve Goals: Good ADL Goals Pt Will Perform Upper Body Bathing: with modified independence Pt Will Perform Lower Body Bathing: with modified independence Pt Will Perform Upper Body Dressing: with modified independence Pt Will Perform Lower Body Dressing: with modified independence Pt Will Transfer to Toilet: with modified independence Pt Will Perform Toileting - Clothing Manipulation and hygiene: with modified independence Pt Will Perform Tub/Shower Transfer: with supervision  OT Frequency: Min 2X/week   Barriers to D/C:    none known at this time          AM-PAC OT "6 Clicks" Daily Activity     Outcome Measure Help from another person eating meals?: A Little Help from another person taking care of personal grooming?: A Little Help from another person  toileting, which includes using toliet, bedpan, or urinal?: A Little Help from another person bathing (including washing, rinsing, drying)?: A Little Help from another person to put on and taking off regular upper body clothing?: A Little Help from another person to put on and taking off regular lower body clothing?: A Little 6 Click Score: 18   End of Session Nurse Communication: Mobility status  Activity Tolerance: Patient tolerated treatment well Patient left: in chair;with chair alarm set;with call bell/phone within reach  OT Visit Diagnosis: Muscle weakness (generalized) (M62.81);Unsteadiness on feet (R26.81)                Time: 2951-8841 OT Time Calculation (min): 21 min Charges:  OT General Charges $OT Visit: 1 Visit OT Evaluation $OT Eval Low Complexity: 1 Low  Calianna Kim P, MS, OTR/L 04/14/2019, 10:29 AM

## 2019-04-14 NOTE — Plan of Care (Signed)
Adequate for discharge.

## 2019-04-14 NOTE — Evaluation (Signed)
Clinical/Bedside Swallow Evaluation Patient Details  Name: Kameryn Davern MRN: [id number] Date of Birth: [birth year]  Today's Date: 04/14/2019 Time: SLP Start Time (ACUTE ONLY): 4098 SLP Stop Time (ACUTE ONLY): 0857 SLP Time Calculation (min) (ACUTE ONLY): 22 min  Past Medical History:  Past Medical History:  Diagnosis Date  . Allergic rhinitis   . Coronary artery disease   . CVA (cerebral vascular accident) (HCC)   . Diabetes mellitus without complication (HCC)   . Hyperlipidemia   . Hypertension   . Neuropathy   . Seizures (HCC)   . Stroke (HCC)   . Thyroid disease    Past Surgical History:  Past Surgical History:  Procedure Laterality Date  . APPENDECTOMY    . BARIATRIC SURGERY    . CARDIAC DEFIBRILLATOR PLACEMENT    . CHOLECYSTECTOMY    . TONSILLECTOMY     HPI:  Ms Kenyette Gundy, 62y/f, presented to ED with left-sided weakness and difficulty speaking. PMH of cardiomyopathy status post AICD, hypertension, CAD, Diabetes Mellitus, hyperlipidemia, neuropath, seizures, stroke and thyroid disease. No prior swallow problems.     Assessment / Plan / Recommendation Clinical Impression  Patient seen at bedside for a clinical swallow evaluation. Upon admission pt failed the [facility] Protocol with nursing.  Pt's oral motor evaluation is remarkable, however she does have hoarse and strained vocal quality. She has a strong cough. Pt given trials of ice chips, water, puree and cracker and tolerated all consistencies of varying volumes with no s/sx of aspiration. Pt did exhibited a dry cough through out evaluation, but felt to be related to hoarseness (pharyngitis/laryngitis) not to PO intake. Multiple CT scans were negative for stroke. Recommend Regular diet with thin liquids. ST to sign off.  SLP Visit Diagnosis: Dysphagia, unspecified (R13.10)    Aspiration Risk  Mild aspiration risk    Diet Recommendation Regular;Thin liquid   Liquid Administration via: Cup;Straw Medication  Administration: Whole meds with liquid Supervision: Patient able to self feed Compensations: Slow rate;Small sips/bites Postural Changes: Seated upright at 90 degrees    Other  Recommendations Oral Care Recommendations: Oral care BID   Follow up Recommendations None                  Prognosis Prognosis for Safe Diet Advancement: Good      Swallow Study   General Date of Onset: 04/13/19 HPI: Ms Hadassah Rana, 62y/f, presented to ED with left-sided weakness and difficulty speaking. PMH of cardiomyopathy status post AICD, hypertension, CAD, Diabetes Mellitus, hyperlipidemia, neuropath, seizures, stroke and thyroid disease. No prior swallow problems.   Type of Study: Bedside Swallow Evaluation Previous Swallow Assessment: none Diet Prior to this Study: NPO Temperature Spikes Noted: No Respiratory Status: Room air History of Recent Intubation: No Behavior/Cognition: Alert;Cooperative;Pleasant mood Oral Cavity Assessment: Within Functional Limits Oral Care Completed by SLP: No Oral Cavity - Dentition: Adequate natural dentition Vision: Functional for self-feeding Self-Feeding Abilities: Able to feed self Patient Positioning: Upright in bed Baseline Vocal Quality: Hoarse Volitional Cough: Strong Volitional Swallow: Able to elicit    Oral/Motor/Sensory Function Overall Oral Motor/Sensory Function: Within functional limits   Ice Chips Ice chips: Within functional limits Presentation: Spoon   Thin Liquid Thin Liquid: Within functional limits Presentation: Cup;Straw    Nectar Thick Nectar Thick Liquid: Not tested   Honey Thick Honey Thick Liquid: Not tested   Puree Puree: Within functional limits   Solid     Solid: Within functional limits     Lindalou Hose Adekunle Rohrbach, MA, CCC-SLP  04/14/2019 10:24 AM

## 2019-04-14 NOTE — TOC Initial Note (Signed)
Transition of Care [facility]) - Initial/Assessment Note    Patient Details  Name: Alexandra Ingram MRN: [id number] Date of Birth: Nov 30, 1955  Transition of Care [facility]) CM/SW Contact:    Pollie Friar, RN Phone Number: 04/14/2019, 10:44 AM  Clinical Narrative:                 OT recommending [place] services. Awaiting PT evals.  Pt denies issues with home medications or transportation. TOC following for d/c needs.   Expected Discharge Plan: [place] Barriers to Discharge: Continued Medical Work up   Patient Goals and CMS Choice   CMS Medicare.gov Compare Post Acute Care list provided to:: Patient Choice offered to / list presented to : Patient  Expected Discharge Plan and Services Expected Discharge Plan: [place]   Discharge Planning Services: CM Consult Post Acute Care Choice: Home Health, Durable Medical Equipment Living arrangements for the past 2 months: Single Family Home                 DME Arranged: 3-N-1                    Prior Living Arrangements/Services Living arrangements for the past 2 months: Single Family Home Lives with:: Adult Children(daughter, son in law and grandchildren) Patient language and need for interpreter reviewed:: Yes(no needs) Do you feel safe going back to the place where you live?: Yes      Need for Family Participation in Patient Care: Yes (Comment)(24 hour supervision recommended) Care giver support system in place?: Yes (comment)(pt states she has 24 hour supervision with her family at home.)   Criminal Activity/Legal Involvement Pertinent to Current Situation/Hospitalization: No - Comment as needed  Activities of Daily Living      Permission Sought/Granted                  Emotional Assessment Appearance:: Appears stated age Attitude/Demeanor/Rapport: Engaged Affect (typically observed): Accepting, Pleasant Orientation: : Oriented to Self, Oriented to Place, Oriented to  Time, Oriented  to Situation   Psych Involvement: No (comment)  Admission diagnosis:  TIA (transient ischemic attack) [G45.9] Patient Active Problem List   Diagnosis Date Noted  . TIA (transient ischemic attack) 04/13/2019  . Cardiomyopathy ([place]) 04/13/2019  . History of stroke 04/13/2019  . Normocytic normochromic anemia 04/13/2019  . Essential hypertension 04/13/2019   PCP:  Shon Hough, MD Pharmacy:   [place] [place] to [address] [place] [address] Phone: [phone] Fax: [phone]     Social Determinants of Health (SDOH) Interventions    Readmission Risk Interventions No flowsheet data found.

## 2019-04-14 NOTE — Progress Notes (Signed)
STROKE TEAM PROGRESS NOTE   INTERVAL HISTORY Daughter at bedside. Pt lying in bed. Stated that she felt much better today. Daughter recounted HPI with me. Pt still complains of left ankle and knee soreness.   Vitals:   04/14/19 0200 04/14/19 0400 04/14/19 0800 04/14/19 1210  BP: 124/70 119/77 122/75 120/81  Pulse: 75 79 82 72  Resp: Temp: 98.6 F (37 C) 98.6 F (37 C) 98.5 F (36.9 C) 98.2 F (36.8 C)  TempSrc: Oral Oral Oral Oral  SpO2: 98% 96% 97% 99%  Weight:      Height:        CBC:  Recent Labs  Lab 04/13/19 1630  04/13/19 2320 04/14/19 0359  WBC 5.9  --  7.9 7.1  NEUTROABS 2.5  --   --   --   HGB 10.1*   < > 10.5* 9.8*  HCT 32.9*   < > 33.2* 30.5*  MCV 73.4*  --  72.2* 70.9*  PLT 167  --  164 156   < > = values in this interval not displayed.    Basic Metabolic Panel:  Recent Labs  Lab 04/13/19 1630 04/13/19 1638 04/13/19 2320 04/14/19 0359  NA 141 142  --  140  K 3.7 3.7  --  3.4*  CL 109 108  --  109  CO2 22  --   --  22  GLUCOSE 137* 130*  --  84  BUN 19 23*  --  16  CREATININE 1.36* 1.30* 1.14* 1.02*  CALCIUM 9.1  --   --  8.9   Lipid Panel:     Component Value Date/Time   CHOL 88 04/14/2019 0359   TRIG 47 04/14/2019 0359   HDL 43 04/14/2019 0359   CHOLHDL 2.0 04/14/2019 0359   VLDL 9 04/14/2019 0359   LDLCALC 36 04/14/2019 0359   HgbA1c:  Lab Results  Component Value Date   HGBA1C 6.0 (H) 04/14/2019   Urine Drug Screen: No results found for: LABOPIA, COCAINSCRNUR, LABBENZ, AMPHETMU, THCU, LABBARB  Alcohol Level     Component Value Date/Time   ETH <10 04/13/2019 1630    IMAGING Ct Code Stroke Cta Head W/wo Contrast  Result Date: 04/13/2019 CLINICAL DATA:  Stroke.  Left-sided weakness and facial droop EXAM: CT ANGIOGRAPHY HEAD AND NECK CT PERFUSION BRAIN TECHNIQUE: Multidetector CT imaging of the head and neck was performed using the standard protocol during bolus administration of intravenous contrast. Multiplanar CT  image reconstructions and MIPs were obtained to evaluate the vascular anatomy. Carotid stenosis measurements (when applicable) are obtained utilizing NASCET criteria, using the distal internal carotid diameter as the denominator. Multiphase CT imaging of the brain was performed following IV bolus contrast injection. Subsequent parametric perfusion maps were calculated using RAPID software. CONTRAST:  OMNIPAQUE IOHEXOL 350 MG/ML SOLN COMPARISON:  CT head 04/13/2019 FINDINGS: CTA NECK FINDINGS Aortic arch: Standard branching. Imaged portion shows no evidence of aneurysm or dissection. No significant stenosis of the major arch vessel origins. Right carotid system: No evidence of dissection, right carotid widely patent. Minimal calcification right carotid bulb without stenosis Left carotid system: Left carotid widely patent without stenosis. No significant atherosclerotic disease. Vertebral arteries: Calcific plaque at the origin of the left vertebral artery with mild-to-moderate stenosis. Remainder of the left vertebral artery is patent to the basilar. Right vertebral artery widely patent without stenosis. Skeleton: Cervical spondylosis.  No acute skeletal abnormality. Other neck: Negative for mass or adenopathy in the neck.  Upper chest: Lung apices clear bilaterally. Review of the MIP images confirms the above findings CTA HEAD FINDINGS Anterior circulation: Cavernous carotid widely patent bilaterally without stenosis. Anterior and middle cerebral arteries patent bilaterally without stenosis or large vessel occlusion Posterior circulation: Both vertebral arteries widely patent to the basilar. PICA not visualized. AICA patent bilaterally basilar widely patent. Superior cerebellar and posterior cerebral arteries patent bilaterally. Venous sinuses: Normal venous enhancement Anatomic variants: None Review of the MIP images confirms the above findings CT Brain Perfusion Findings: ASPECTS: 10 CBF (<30%) Volume: 0mL  Perfusion (Tmax>6.0s) volume: 0mL Mismatch Volume: 0mL Infarction Location:None IMPRESSION: 1. CT perfusion negative for acute infarct or ischemia 2. Negative for emergent large vessel intracranial occlusion 3. No significant carotid stenosis. Mild to moderate stenosis origin of left vertebral artery. Electronically Signed   By: Marlan Palau M.D.   On: 04/13/2019 17:20   Ct Head Wo Contrast  Result Date: 04/14/2019 CLINICAL DATA:  TIA EXAM: CT HEAD WITHOUT CONTRAST TECHNIQUE: Contiguous axial images were obtained from the base of the skull through the vertex without intravenous contrast. COMPARISON:  04/13/2019 FINDINGS: Brain: No acute intracranial abnormality. Specifically, no hemorrhage, hydrocephalus, mass lesion, acute infarction, or significant intracranial injury. Vascular: No hyperdense vessel or unexpected calcification. Skull: No acute calvarial abnormality. Sinuses/Orbits: Visualized paranasal sinuses and mastoids clear. Orbital soft tissues unremarkable. Other: None IMPRESSION: Normal study. Electronically Signed   By: Charlett Nose M.D.   On: 04/14/2019 10:02   Ct Head Wo Contrast  Result Date: 04/13/2019 CLINICAL DATA:  Evaluate for infarct. EXAM: CT HEAD WITHOUT CONTRAST TECHNIQUE: Contiguous axial images were obtained from the base of the skull through the vertex without intravenous contrast. COMPARISON:  Earlier today FINDINGS: Brain: No evidence of acute infarction, hemorrhage, hydrocephalus, extra-axial collection or mass lesion/mass effect. Vascular: No hyperdense vessel or unexpected calcification. Skull: Normal. Negative for fracture or focal lesion. Sinuses/Orbits: No acute finding. Other: None IMPRESSION: No acute intracranial abnormalities. Normal brain. Electronically Signed   By: Signa Kell M.D.   On: 04/13/2019 19:41   Ct Code Stroke Cta Neck W/wo Contrast  Result Date: 04/13/2019 CLINICAL DATA:  Stroke.  Left-sided weakness and facial droop EXAM: CT ANGIOGRAPHY HEAD  AND NECK CT PERFUSION BRAIN TECHNIQUE: Multidetector CT imaging of the head and neck was performed using the standard protocol during bolus administration of intravenous contrast. Multiplanar CT image reconstructions and MIPs were obtained to evaluate the vascular anatomy. Carotid stenosis measurements (when applicable) are obtained utilizing NASCET criteria, using the distal internal carotid diameter as the denominator. Multiphase CT imaging of the brain was performed following IV bolus contrast injection. Subsequent parametric perfusion maps were calculated using RAPID software. CONTRAST:  OMNIPAQUE IOHEXOL 350 MG/ML SOLN COMPARISON:  CT head 04/13/2019 FINDINGS: CTA NECK FINDINGS Aortic arch: Standard branching. Imaged portion shows no evidence of aneurysm or dissection. No significant stenosis of the major arch vessel origins. Right carotid system: No evidence of dissection, right carotid widely patent. Minimal calcification right carotid bulb without stenosis Left carotid system: Left carotid widely patent without stenosis. No significant atherosclerotic disease. Vertebral arteries: Calcific plaque at the origin of the left vertebral artery with mild-to-moderate stenosis. Remainder of the left vertebral artery is patent to the basilar. Right vertebral artery widely patent without stenosis. Skeleton: Cervical spondylosis.  No acute skeletal abnormality. Other neck: Negative for mass or adenopathy in the neck. Upper chest: Lung apices clear bilaterally. Review of the MIP images confirms the above findings CTA HEAD FINDINGS Anterior circulation: Cavernous  carotid widely patent bilaterally without stenosis. Anterior and middle cerebral arteries patent bilaterally without stenosis or large vessel occlusion Posterior circulation: Both vertebral arteries widely patent to the basilar. PICA not visualized. AICA patent bilaterally basilar widely patent. Superior cerebellar and posterior cerebral arteries patent  bilaterally. Venous sinuses: Normal venous enhancement Anatomic variants: None Review of the MIP images confirms the above findings CT Brain Perfusion Findings: ASPECTS: 10 CBF (<30%) Volume: 19mL Perfusion (Tmax>6.0s) volume: 46mL Mismatch Volume: 68mL Infarction Location:None IMPRESSION: 1. CT perfusion negative for acute infarct or ischemia 2. Negative for emergent large vessel intracranial occlusion 3. No significant carotid stenosis. Mild to moderate stenosis origin of left vertebral artery. Electronically Signed   By: Franchot Gallo M.D.   On: 04/13/2019 17:20   Ct Code Stroke Cta Cerebral Perfusion W/wo Contrast  Result Date: 04/13/2019 CLINICAL DATA:  Stroke.  Left-sided weakness and facial droop EXAM: CT ANGIOGRAPHY HEAD AND NECK CT PERFUSION BRAIN TECHNIQUE: Multidetector CT imaging of the head and neck was performed using the standard protocol during bolus administration of intravenous contrast. Multiplanar CT image reconstructions and MIPs were obtained to evaluate the vascular anatomy. Carotid stenosis measurements (when applicable) are obtained utilizing NASCET criteria, using the distal internal carotid diameter as the denominator. Multiphase CT imaging of the brain was performed following IV bolus contrast injection. Subsequent parametric perfusion maps were calculated using RAPID software. CONTRAST:  169mL OMNIPAQUE IOHEXOL 350 MG/ML SOLN COMPARISON:  CT head 04/13/2019 FINDINGS: CTA NECK FINDINGS Aortic arch: Standard branching. Imaged portion shows no evidence of aneurysm or dissection. No significant stenosis of the major arch vessel origins. Right carotid system: No evidence of dissection, right carotid widely patent. Minimal calcification right carotid bulb without stenosis Left carotid system: Left carotid widely patent without stenosis. No significant atherosclerotic disease. Vertebral arteries: Calcific plaque at the origin of the left vertebral artery with mild-to-moderate stenosis.  Remainder of the left vertebral artery is patent to the basilar. Right vertebral artery widely patent without stenosis. Skeleton: Cervical spondylosis.  No acute skeletal abnormality. Other neck: Negative for mass or adenopathy in the neck. Upper chest: Lung apices clear bilaterally. Review of the MIP images confirms the above findings CTA HEAD FINDINGS Anterior circulation: Cavernous carotid widely patent bilaterally without stenosis. Anterior and middle cerebral arteries patent bilaterally without stenosis or large vessel occlusion Posterior circulation: Both vertebral arteries widely patent to the basilar. PICA not visualized. AICA patent bilaterally basilar widely patent. Superior cerebellar and posterior cerebral arteries patent bilaterally. Venous sinuses: Normal venous enhancement Anatomic variants: None Review of the MIP images confirms the above findings CT Brain Perfusion Findings: ASPECTS: 10 CBF (<30%) Volume: 62mL Perfusion (Tmax>6.0s) volume: 23mL Mismatch Volume: 45mL Infarction Location:None IMPRESSION: 1. CT perfusion negative for acute infarct or ischemia 2. Negative for emergent large vessel intracranial occlusion 3. No significant carotid stenosis. Mild to moderate stenosis origin of left vertebral artery. Electronically Signed   By: Franchot Gallo M.D.   On: 04/13/2019 17:20   Ct Angio Chest/abd/pel For Dissection W And/or Wo Contrast  Result Date: 04/13/2019 CLINICAL DATA:  Chest and back pain, question of aortic dissection EXAM: CT ANGIOGRAPHY CHEST, ABDOMEN AND PELVIS TECHNIQUE: Multidetector CT imaging through the chest, abdomen and pelvis was performed using the standard protocol during bolus administration of intravenous contrast. Multiplanar reconstructed images and MIPs were obtained and reviewed to evaluate the vascular anatomy. CONTRAST:  54mL OMNIPAQUE IOHEXOL 350 MG/ML SOLN COMPARISON:  None. FINDINGS: CTA CHEST FINDINGS Cardiovascular: --Heart: The heart size is normal.  There is  nopericardial effusion. --Aorta: The course and caliber of the thoracic aorta are normal. There is mild scattered aortic atherosclerotic calcification. Precontrast images show no aortic intramural hematoma. There is no blood pool, dissection or penetrating ulcer demonstrated on arterial phase postcontrast imaging. There is a conventional 3 vessel aortic arch branching pattern. The proximal arch vessels are widely patent. --Pulmonary Arteries: Contrast timing is optimized for preferential opacification of the aorta. Within that limitation, normal central pulmonary arteries. Mediastinum/Nodes: A left-sided pacemaker seen with the lead tips in the right ventricle. No mediastinal, hilar or axillary lymphadenopathy. The visualized thyroid and thoracic esophageal course are unremarkable. Lungs/Pleura: No pulmonary nodules or masses. No pleural effusion or pneumothorax. No focal airspace consolidation. No focal pleural abnormality. Musculoskeletal: No chest wall abnormality. No acute osseous findings. Review of the MIP images confirms the above findings. CTA ABDOMEN AND PELVIS FINDINGS VASCULAR Aorta: Normal caliber aorta without aneurysm, dissection, vasculitis or hemodynamically significant stenosis. There is mild aortic atherosclerosis. Celiac: No aneurysm, dissection or hemodynamically significant stenosis. Normal branching pattern SMA: Widely patent without dissection or stenosis. Renals: Single renal arteries bilaterally. No aneurysm, dissection, stenosis or evidence of fibromuscular dysplasia. IMA: Patent without abnormality. Inflow: No aneurysm, stenosis or dissection. Veins: Normal course and caliber of the major veins. Assessment is otherwise limited by the arterial dominant contrast phase. Review of the MIP images confirms the above findings. NON-VASCULAR Hepatobiliary: Normal hepatic contours and density. No visible biliary dilatation. The patient is status post cholecystectomy. No biliary ductal dilation.  Pancreas: Normal contours without ductal dilatation. No peripancreatic fluid collection. Spleen: Normal arterial phase splenic enhancement pattern. Adrenals/Urinary Tract: --Adrenal glands: Normal. --Right kidney/ureter: Multiple scattered renal calculi are noted, less than 1 cm. No hydronephrosis however is seen. --Left kidney/ureter: Multiple scattered renal calculi are noted, less than 1 cm. No hydronephrosis. There is a low-density 1 cm lesion seen in the upper pole the left kidney. --Urinary bladder: Unremarkable. Stomach/Bowel: --Stomach/Duodenum: A small hiatal hernia is present. The patient has had prior gastric sleeve. --Small bowel: No dilatation or inflammation. --Colon: Scattered colonic diverticula are noted without diverticulitis. --Appendix: Normal. Lymphatic:  No abdominal or pelvic lymphadenopathy. Reproductive: No free fluid in the pelvis. Musculoskeletal. No bony spinal canal stenosis or focal osseous abnormality. Degenerative changes are seen in the lower thoracic spine. Other: None. Review of the MIP images confirms the above findings. IMPRESSION: 1. No acute aortic abnormality. 2. Mild aortic Atherosclerosis (ICD10-I70.0). 3. Multiple bilateral subcentimeter nonobstructing renal calculi. 4. No acute intrathoracic pathology. Electronically Signed   By: Jonna Clark M.D.   On: 04/13/2019 21:54   Ct Head Code Stroke Wo Contrast  Result Date: 04/13/2019 CLINICAL DATA:  Code stroke.  Left-sided weakness and facial droop. EXAM: CT HEAD WITHOUT CONTRAST TECHNIQUE: Contiguous axial images were obtained from the base of the skull through the vertex without intravenous contrast. COMPARISON:  None. FINDINGS: Brain: Normal appearance of brain parenchyma without evidence of old or acute infarction, mass lesion, hemorrhage, hydrocephalus or extra-axial collection. Vascular: There is atherosclerotic calcification of the major vessels at the base of the brain. Skull: Normal Sinuses/Orbits: Clear Other:  None ASPECTS (Alberta Stroke Program Early CT Score) - Ganglionic level infarction (caudate, lentiform nuclei, internal capsule, insula, M1-M3 cortex): 7 - Supraganglionic infarction (M4-M6 cortex): 3 Total score (0-10 with 10 being normal): 10 IMPRESSION: 1. Normal appearance of the brain parenchyma. No sign of old or acute infarction. There is atherosclerotic calcification of the major vessels at the base of the brain. 2.  ASPECTS is 10 3. These results were communicated to Dr. Otelia Limes at 4:44 pmon 10/22/2020by text page via the [facility] messaging system. Electronically Signed   By: Paulina Fusi M.D.   On: 04/13/2019 16:45    PHYSICAL EXAM  Temp:  [97.7 F (36.5 C)-98.6 F (37 C)] 98.2 F (36.8 C) (10/23 1210) Pulse Rate:  [72-90] 72 (10/23 1210) Resp:  [11-31] 18 (10/23 0800) BP: (100-136)/(44-105) 120/81 (10/23 1210) SpO2:  [96 %-100 %] 99 % (10/23 1210) Weight:  [89.1 kg] 89.1 kg (10/23 0140)  General - Well nourished, well developed, in no apparent distress, mild lethargic.  Ophthalmologic - fundi not visualized due to noncooperation.  Cardiovascular - Regular rhythm and rate.  Mental Status -  Level of arousal and orientation to time, place, and person were intact. Language including expression, naming, repetition, comprehension was assessed and found intact. Fund of Knowledge was assessed and was intact.  Cranial Nerves II - XII - II - Visual field intact OU. III, IV, VI - Extraocular movements intact. V - Facial sensation intact bilaterally. VII - mild left nasolabial fold flattening, chroic. VIII - Hearing & vestibular intact bilaterally. X - Palate elevates symmetrically. XI - Chin turning & shoulder shrug intact bilaterally. XII - Tongue protrusion intact.  Motor Strength - The patient's strength was normal in all extremities and pronator drift was absent except left knee flexion 4+/5 due to left knee soreness.  Bulk was normal and fasciculations were absent.   Motor Tone  - Muscle tone was assessed at the neck and appendages and was normal.  Reflexes - The patient's reflexes were symmetrical in all extremities and she had no pathological reflexes.  Sensory - Light touch, temperature/pinprick were assessed and were symmetrical.    Coordination - The patient had normal movements in the hands with no ataxia or dysmetria.  Tremor was absent.  Gait and Station - deferred.   ASSESSMENT/PLAN Alexandra Ingram is a 63 y.o. female with history of stroke without residual deficits, HTN, DB presenting with L sided weakness.   Likely presyncope - less likely TIA or stroke   Low with EMS and on admission - generalized weakness - weaker on the left due to recrudescence of old stroke  Code Stroke CT head No acute abnormality.ASPECTS 10.     CTA head & neck no ELVO. Mild to moderate stenosis origin L VA  CT perfusion negative   Repeat CT head x 2 no acute abnormality  2D Echo pending, 12/2018 EF 45%  LDL 36  HgbA1c 6.0  Lovenox 40 mg sq daily for VTE prophylaxis  Takes ASA 81mg  daily prior to admission as per pt, now on aspirin 325 mg daily. Continue ASA 325mg  daily on discharge.  Therapy recommendations:  HH PT and HH OT  Disposition:  Return home  Follow up with [facility]  Hx stroke/TIA  per report. No documentation of infarct in Epic.   Reported left sided weakness at that time but no residue deficit   Had MRI in 05/2012 which was neg.  Hypertension  Stable on the low side  Avoid low BP  Encourage po intake . 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 Please call with questions. No neuro follow up needed at this time. If neuro follow up, prefer in [facility] system as [place] in [place], [place]. Thanks for the consult.  Marvel PlanJindong Dalani Mette, MD PhD Stroke Neurology 04/14/2019 1:42 PM    To contact Stroke Continuity provider, please refer to [email or web address]. After hours, contact General Neurology

## 2019-04-14 NOTE — Progress Notes (Signed)
  Echocardiogram 2D Echocardiogram has been performed.  [name] 04/14/2019, 12:34 PM

## 2019-04-15 DIAGNOSIS — R55 Syncope and collapse: Secondary | ICD-10-CM

## 2019-04-15 DIAGNOSIS — R42 Dizziness and giddiness: Secondary | ICD-10-CM

## 2019-04-15 NOTE — Discharge Summary (Signed)
Physician Discharge Summary  Alexandra Ingram AJO:[id number] DOB: 04/19/1956 DOA: 04/13/2019  PCP: Margorie John, MD  Admit date: 04/13/2019 Discharge date: 04/14/2019  Admitted From: home Discharge disposition: home   Recommendations for Outpatient Follow-Up:   1. BP medications adjusted, please follow up   Discharge Diagnosis:   Active Problems:   Cardiomyopathy (HCC)   History of stroke   Normocytic normochromic anemia   Essential hypertension   Postural dizziness with presyncope    Discharge Condition: Improved.  Diet recommendation: Low sodium, heart healthy.  Carbohydrate-modified.  Regular.  Wound care: None.  Code status: Full.   History of Present Illness:   Alexandra Ingram is a 63 y.o. female with previous history of stroke with left-sided weakness, cardiomyopathy status post AICD, hypertension usually follows at [facility] is presently living at patient's daughter's house last few months at around 4 PM started having difficulty getting up from the commode and EMS was called.  Patient was noticed to have left facial droop and left-sided weakness and was brought to the ER.  Patient also had some speech difficulties   Hospital Course by Problem:   Likely presyncope / vasovagal  BP Low with EMS and on admission - generalized weakness - weaker on the left due to recrudescence of old stroke  CTA head & neck no ELVO. Mild to moderate stenosis origin L VA  CT perfusion negative   Repeat CT head x 2 no acute abnormality  2D Echo: EF 55%  LDL 36  HgbA1c 6.0   Continue ASA 325mg  daily on discharge.   HH PT and HH OT   Hx stroke/TIA  per report. No documentation of infarct in Epic.   Reported left sided weakness at that time but no residue deficit   Had MRI in 05/2012 which was neg.  Hypertension  Stable on the low side  Avoid low BP  Medications adjusted  Hyperlipidemia   decrease lipitor 20  LDL 36, goal <  70   Diabetes type II Controlled  HgbA1c 6.0, goal < 7.0      Medical Consultants:   neurology   Discharge Exam:   Vitals:   04/14/19 1210 04/14/19 1636  BP: 120/81 118/85  Pulse: 72 85  Resp:  18  Temp: 98.2 F (36.8 C) 98.4 F (36.9 C)  SpO2: 99% 97%   Vitals:   04/14/19 0400 04/14/19 0800 04/14/19 1210 04/14/19 1636  BP: 119/77 122/75 120/81 118/85  Pulse: 79 82 72 85  Resp: 17 18  18   Temp: 98.6 F (37 C) 98.5 F (36.9 C) 98.2 F (36.8 C) 98.4 F (36.9 C)  TempSrc: Oral Oral Oral Oral  SpO2: 96% 97% 99% 97%  Weight:      Height:        General exam: Appears calm and comfortable.  The results of significant diagnostics from this hospitalization (including imaging, microbiology, ancillary and laboratory) are listed below for reference.     Procedures and Diagnostic Studies:   Ct Code Stroke Cta Head W/wo Contrast  Result Date: 04/13/2019 CLINICAL DATA:  Stroke.  Left-sided weakness and facial droop EXAM: CT ANGIOGRAPHY HEAD AND NECK CT PERFUSION BRAIN TECHNIQUE: Multidetector CT imaging of the head and neck was performed using the standard protocol during bolus administration of intravenous contrast. Multiplanar CT image reconstructions and MIPs were obtained to evaluate the vascular anatomy. Carotid stenosis measurements (when applicable) are obtained utilizing NASCET criteria, using the distal internal carotid diameter as the denominator.  Multiphase CT imaging of the brain was performed following IV bolus contrast injection. Subsequent parametric perfusion maps were calculated using RAPID software. CONTRAST:  OMNIPAQUE IOHEXOL 350 MG/ML SOLN COMPARISON:  CT head 04/13/2019 FINDINGS: CTA NECK FINDINGS Aortic arch: Standard branching. Imaged portion shows no evidence of aneurysm or dissection. No significant stenosis of the major arch vessel origins. Right carotid system: No evidence of dissection, right carotid widely patent. Minimal calcification  right carotid bulb without stenosis Left carotid system: Left carotid widely patent without stenosis. No significant atherosclerotic disease. Vertebral arteries: Calcific plaque at the origin of the left vertebral artery with mild-to-moderate stenosis. Remainder of the left vertebral artery is patent to the basilar. Right vertebral artery widely patent without stenosis. Skeleton: Cervical spondylosis.  No acute skeletal abnormality. Other neck: Negative for mass or adenopathy in the neck. Upper chest: Lung apices clear bilaterally. Review of the MIP images confirms the above findings CTA HEAD FINDINGS Anterior circulation: Cavernous carotid widely patent bilaterally without stenosis. Anterior and middle cerebral arteries patent bilaterally without stenosis or large vessel occlusion Posterior circulation: Both vertebral arteries widely patent to the basilar. PICA not visualized. AICA patent bilaterally basilar widely patent. Superior cerebellar and posterior cerebral arteries patent bilaterally. Venous sinuses: Normal venous enhancement Anatomic variants: None Review of the MIP images confirms the above findings CT Brain Perfusion Findings: ASPECTS: 10 CBF (<30%) Volume: 0mL Perfusion (Tmax>6.0s) volume: 0mL Mismatch Volume: 0mL Infarction Location:None IMPRESSION: 1. CT perfusion negative for acute infarct or ischemia 2. Negative for emergent large vessel intracranial occlusion 3. No significant carotid stenosis. Mild to moderate stenosis origin of left vertebral artery. Electronically Signed   By: Marlan Palau M.D.   On: 04/13/2019 17:20   Ct Head Wo Contrast  Result Date: 04/14/2019 CLINICAL DATA:  TIA EXAM: CT HEAD WITHOUT CONTRAST TECHNIQUE: Contiguous axial images were obtained from the base of the skull through the vertex without intravenous contrast. COMPARISON:  04/13/2019 FINDINGS: Brain: No acute intracranial abnormality. Specifically, no hemorrhage, hydrocephalus, mass lesion, acute infarction, or  significant intracranial injury. Vascular: No hyperdense vessel or unexpected calcification. Skull: No acute calvarial abnormality. Sinuses/Orbits: Visualized paranasal sinuses and mastoids clear. Orbital soft tissues unremarkable. Other: None IMPRESSION: Normal study. Electronically Signed   By: Charlett Nose M.D.   On: 04/14/2019 10:02   Ct Head Wo Contrast  Result Date: 04/13/2019 CLINICAL DATA:  Evaluate for infarct. EXAM: CT HEAD WITHOUT CONTRAST TECHNIQUE: Contiguous axial images were obtained from the base of the skull through the vertex without intravenous contrast. COMPARISON:  Earlier today FINDINGS: Brain: No evidence of acute infarction, hemorrhage, hydrocephalus, extra-axial collection or mass lesion/mass effect. Vascular: No hyperdense vessel or unexpected calcification. Skull: Normal. Negative for fracture or focal lesion. Sinuses/Orbits: No acute finding. Other: None IMPRESSION: No acute intracranial abnormalities. Normal brain. Electronically Signed   By: Signa Kell M.D.   On: 04/13/2019 19:41   Ct Code Stroke Cta Neck W/wo Contrast  Result Date: 04/13/2019 CLINICAL DATA:  Stroke.  Left-sided weakness and facial droop EXAM: CT ANGIOGRAPHY HEAD AND NECK CT PERFUSION BRAIN TECHNIQUE: Multidetector CT imaging of the head and neck was performed using the standard protocol during bolus administration of intravenous contrast. Multiplanar CT image reconstructions and MIPs were obtained to evaluate the vascular anatomy. Carotid stenosis measurements (when applicable) are obtained utilizing NASCET criteria, using the distal internal carotid diameter as the denominator. Multiphase CT imaging of the brain was performed following IV bolus contrast injection. Subsequent parametric perfusion maps were calculated using RAPID  software. CONTRAST:  143mL OMNIPAQUE IOHEXOL 350 MG/ML SOLN COMPARISON:  CT head 04/13/2019 FINDINGS: CTA NECK FINDINGS Aortic arch: Standard branching. Imaged portion shows no  evidence of aneurysm or dissection. No significant stenosis of the major arch vessel origins. Right carotid system: No evidence of dissection, right carotid widely patent. Minimal calcification right carotid bulb without stenosis Left carotid system: Left carotid widely patent without stenosis. No significant atherosclerotic disease. Vertebral arteries: Calcific plaque at the origin of the left vertebral artery with mild-to-moderate stenosis. Remainder of the left vertebral artery is patent to the basilar. Right vertebral artery widely patent without stenosis. Skeleton: Cervical spondylosis.  No acute skeletal abnormality. Other neck: Negative for mass or adenopathy in the neck. Upper chest: Lung apices clear bilaterally. Review of the MIP images confirms the above findings CTA HEAD FINDINGS Anterior circulation: Cavernous carotid widely patent bilaterally without stenosis. Anterior and middle cerebral arteries patent bilaterally without stenosis or large vessel occlusion Posterior circulation: Both vertebral arteries widely patent to the basilar. PICA not visualized. AICA patent bilaterally basilar widely patent. Superior cerebellar and posterior cerebral arteries patent bilaterally. Venous sinuses: Normal venous enhancement Anatomic variants: None Review of the MIP images confirms the above findings CT Brain Perfusion Findings: ASPECTS: 10 CBF (<30%) Volume: 47mL Perfusion (Tmax>6.0s) volume: 32mL Mismatch Volume: 38mL Infarction Location:None IMPRESSION: 1. CT perfusion negative for acute infarct or ischemia 2. Negative for emergent large vessel intracranial occlusion 3. No significant carotid stenosis. Mild to moderate stenosis origin of left vertebral artery. Electronically Signed   By: Franchot Gallo M.D.   On: 04/13/2019 17:20   Ct Code Stroke Cta Cerebral Perfusion W/wo Contrast  Result Date: 04/13/2019 CLINICAL DATA:  Stroke.  Left-sided weakness and facial droop EXAM: CT ANGIOGRAPHY HEAD AND NECK CT  PERFUSION BRAIN TECHNIQUE: Multidetector CT imaging of the head and neck was performed using the standard protocol during bolus administration of intravenous contrast. Multiplanar CT image reconstructions and MIPs were obtained to evaluate the vascular anatomy. Carotid stenosis measurements (when applicable) are obtained utilizing NASCET criteria, using the distal internal carotid diameter as the denominator. Multiphase CT imaging of the brain was performed following IV bolus contrast injection. Subsequent parametric perfusion maps were calculated using RAPID software. CONTRAST:  148mL OMNIPAQUE IOHEXOL 350 MG/ML SOLN COMPARISON:  CT head 04/13/2019 FINDINGS: CTA NECK FINDINGS Aortic arch: Standard branching. Imaged portion shows no evidence of aneurysm or dissection. No significant stenosis of the major arch vessel origins. Right carotid system: No evidence of dissection, right carotid widely patent. Minimal calcification right carotid bulb without stenosis Left carotid system: Left carotid widely patent without stenosis. No significant atherosclerotic disease. Vertebral arteries: Calcific plaque at the origin of the left vertebral artery with mild-to-moderate stenosis. Remainder of the left vertebral artery is patent to the basilar. Right vertebral artery widely patent without stenosis. Skeleton: Cervical spondylosis.  No acute skeletal abnormality. Other neck: Negative for mass or adenopathy in the neck. Upper chest: Lung apices clear bilaterally. Review of the MIP images confirms the above findings CTA HEAD FINDINGS Anterior circulation: Cavernous carotid widely patent bilaterally without stenosis. Anterior and middle cerebral arteries patent bilaterally without stenosis or large vessel occlusion Posterior circulation: Both vertebral arteries widely patent to the basilar. PICA not visualized. AICA patent bilaterally basilar widely patent. Superior cerebellar and posterior cerebral arteries patent bilaterally.  Venous sinuses: Normal venous enhancement Anatomic variants: None Review of the MIP images confirms the above findings CT Brain Perfusion Findings: ASPECTS: 10 CBF (<30%) Volume: 52mL Perfusion (Tmax>6.0s) volume:  0mL Mismatch Volume: 0mL Infarction Location:None IMPRESSION: 1. CT perfusion negative for acute infarct or ischemia 2. Negative for emergent large vessel intracranial occlusion 3. No significant carotid stenosis. Mild to moderate stenosis origin of left vertebral artery. Electronically Signed   By: Marlan Palau M.D.   On: 04/13/2019 17:20   Ct Angio Chest/abd/pel For Dissection W And/or Wo Contrast  Result Date: 04/13/2019 CLINICAL DATA:  Chest and back pain, question of aortic dissection EXAM: CT ANGIOGRAPHY CHEST, ABDOMEN AND PELVIS TECHNIQUE: Multidetector CT imaging through the chest, abdomen and pelvis was performed using the standard protocol during bolus administration of intravenous contrast. Multiplanar reconstructed images and MIPs were obtained and reviewed to evaluate the vascular anatomy. CONTRAST:  80mL OMNIPAQUE IOHEXOL 350 MG/ML SOLN COMPARISON:  None. FINDINGS: CTA CHEST FINDINGS Cardiovascular: --Heart: The heart size is normal.  There is nopericardial effusion. --Aorta: The course and caliber of the thoracic aorta are normal. There is mild scattered aortic atherosclerotic calcification. Precontrast images show no aortic intramural hematoma. There is no blood pool, dissection or penetrating ulcer demonstrated on arterial phase postcontrast imaging. There is a conventional 3 vessel aortic arch branching pattern. The proximal arch vessels are widely patent. --Pulmonary Arteries: Contrast timing is optimized for preferential opacification of the aorta. Within that limitation, normal central pulmonary arteries. Mediastinum/Nodes: A left-sided pacemaker seen with the lead tips in the right ventricle. No mediastinal, hilar or axillary lymphadenopathy. The visualized thyroid and thoracic  esophageal course are unremarkable. Lungs/Pleura: No pulmonary nodules or masses. No pleural effusion or pneumothorax. No focal airspace consolidation. No focal pleural abnormality. Musculoskeletal: No chest wall abnormality. No acute osseous findings. Review of the MIP images confirms the above findings. CTA ABDOMEN AND PELVIS FINDINGS VASCULAR Aorta: Normal caliber aorta without aneurysm, dissection, vasculitis or hemodynamically significant stenosis. There is mild aortic atherosclerosis. Celiac: No aneurysm, dissection or hemodynamically significant stenosis. Normal branching pattern SMA: Widely patent without dissection or stenosis. Renals: Single renal arteries bilaterally. No aneurysm, dissection, stenosis or evidence of fibromuscular dysplasia. IMA: Patent without abnormality. Inflow: No aneurysm, stenosis or dissection. Veins: Normal course and caliber of the major veins. Assessment is otherwise limited by the arterial dominant contrast phase. Review of the MIP images confirms the above findings. NON-VASCULAR Hepatobiliary: Normal hepatic contours and density. No visible biliary dilatation. The patient is status post cholecystectomy. No biliary ductal dilation. Pancreas: Normal contours without ductal dilatation. No peripancreatic fluid collection. Spleen: Normal arterial phase splenic enhancement pattern. Adrenals/Urinary Tract: --Adrenal glands: Normal. --Right kidney/ureter: Multiple scattered renal calculi are noted, less than 1 cm. No hydronephrosis however is seen. --Left kidney/ureter: Multiple scattered renal calculi are noted, less than 1 cm. No hydronephrosis. There is a low-density 1 cm lesion seen in the upper pole the left kidney. --Urinary bladder: Unremarkable. Stomach/Bowel: --Stomach/Duodenum: A small hiatal hernia is present. The patient has had prior gastric sleeve. --Small bowel: No dilatation or inflammation. --Colon: Scattered colonic diverticula are noted without diverticulitis.  --Appendix: Normal. Lymphatic:  No abdominal or pelvic lymphadenopathy. Reproductive: No free fluid in the pelvis. Musculoskeletal. No bony spinal canal stenosis or focal osseous abnormality. Degenerative changes are seen in the lower thoracic spine. Other: None. Review of the MIP images confirms the above findings. IMPRESSION: 1. No acute aortic abnormality. 2. Mild aortic Atherosclerosis (ICD10-I70.0). 3. Multiple bilateral subcentimeter nonobstructing renal calculi. 4. No acute intrathoracic pathology. Electronically Signed   By: Jonna Clark M.D.   On: 04/13/2019 21:54   Ct Head Code Stroke Wo Contrast  Result Date: 04/13/2019  CLINICAL DATA:  Code stroke.  Left-sided weakness and facial droop. EXAM: CT HEAD WITHOUT CONTRAST TECHNIQUE: Contiguous axial images were obtained from the base of the skull through the vertex without intravenous contrast. COMPARISON:  None. FINDINGS: Brain: Normal appearance of brain parenchyma without evidence of old or acute infarction, mass lesion, hemorrhage, hydrocephalus or extra-axial collection. Vascular: There is atherosclerotic calcification of the major vessels at the base of the brain. Skull: Normal Sinuses/Orbits: Clear Other: None ASPECTS (Alberta Stroke Program Early CT Score) - Ganglionic level infarction (caudate, lentiform nuclei, internal capsule, insula, M1-M3 cortex): 7 - Supraganglionic infarction (M4-M6 cortex): 3 Total score (0-10 with 10 being normal): 10 IMPRESSION: 1. Normal appearance of the brain parenchyma. No sign of old or acute infarction. There is atherosclerotic calcification of the major vessels at the base of the brain. 2. ASPECTS is 10 3. These results were communicated to Dr. Otelia LimesLindzen at 4:44 pmon 10/22/2020by text page via the [facility] messaging system. Electronically Signed   By: Paulina FusiMark  Shogry M.D.   On: 04/13/2019 16:45     Labs:   Basic Metabolic Panel: Recent Labs  Lab 04/13/19 1630 04/13/19 1638 04/13/19 2320 04/14/19 0359  NA 141  142  --  140  K 3.7 3.7  --  3.4*  CL 109 108  --  109  CO2 22  --   --  22  GLUCOSE 137* 130*  --  84  BUN 19 23*  --  16  CREATININE 1.36* 1.30* 1.14* 1.02*  CALCIUM 9.1  --   --  8.9   GFR Estimated Creatinine Clearance: 60.8 mL/min (A) (by C-G formula based on SCr of 1.02 mg/dL (H)). Liver Function Tests: Recent Labs  Lab 04/13/19 1630 04/14/19 0359  AST 33 25  ALT 30 26  ALKPHOS 111 105  BILITOT 0.4 0.6  PROT 6.2* 6.0*  ALBUMIN 3.6 3.3*   No results for input(s): LIPASE, AMYLASE in the last 168 hours. No results for input(s): AMMONIA in the last 168 hours. Coagulation profile Recent Labs  Lab 04/13/19 1719  INR 1.2    CBC: Recent Labs  Lab 04/13/19 1630 04/13/19 1638 04/13/19 2320 04/14/19 0359  WBC 5.9  --  7.9 7.1  NEUTROABS 2.5  --   --   --   HGB 10.1* 11.6* 10.5* 9.8*  HCT 32.9* 34.0* 33.2* 30.5*  MCV 73.4*  --  72.2* 70.9*  PLT 167  --  164 156   Cardiac Enzymes: No results for input(s): CKTOTAL, CKMB, CKMBINDEX, TROPONINI in the last 168 hours. BNP: Invalid input(s): POCBNP CBG: Recent Labs  Lab 04/13/19 1630 04/14/19 0935 04/14/19 1634  GLUCAP 128* 92 108*   D-Dimer No results for input(s): DDIMER in the last 72 hours. Hgb A1c Recent Labs    04/13/19 2320 04/14/19 0359  HGBA1C 6.0* 6.0*   Lipid Profile Recent Labs    04/14/19 0359  CHOL 88  HDL 43  LDLCALC 36  TRIG 47  CHOLHDL 2.0   Thyroid function studies No results for input(s): TSH, T4TOTAL, T3FREE, THYROIDAB in the last 72 hours.  Invalid input(s): FREET3 Anemia work up No results for input(s): VITAMINB12, FOLATE, FERRITIN, TIBC, IRON, RETICCTPCT in the last 72 hours. Microbiology Recent Results (from the past 240 hour(s))  SARS CORONAVIRUS 2 (TAT 6-24 HRS) Nasopharyngeal Nasopharyngeal Swab     Status: None   Collection Time: 04/13/19 11:34 PM   Specimen: Nasopharyngeal Swab  Result Value Ref Range Status   SARS Coronavirus 2 NEGATIVE NEGATIVE  Final     Comment: (NOTE) SARS-CoV-2 target nucleic acids are NOT DETECTED. The SARS-CoV-2 RNA is generally detectable in upper and lower respiratory specimens during the acute phase of infection. Negative results do not preclude SARS-CoV-2 infection, do not rule out co-infections with other pathogens, and should not be used as the sole basis for treatment or other patient management decisions. Negative results must be combined with clinical observations, patient history, and epidemiological information. The expected result is Negative. Fact Sheet for Patients: HairSlick.no Fact Sheet for Healthcare Providers: quierodirigir.com This test is not yet approved or cleared by the [place] FDA and  has been authorized for detection and/or diagnosis of SARS-CoV-2 by FDA under an Emergency Use Authorization (EUA). This EUA will remain  in effect (meaning this test can be used) for the duration of the COVID-19 declaration under Section 56 4(b)(1) of the Act, 21 U.S.C. section 360bbb-3(b)(1), unless the authorization is terminated or revoked sooner. Performed at [facility], [address] [place], [place] [address]      Discharge Instructions:   Discharge Instructions    Diet - low sodium heart healthy   Complete by: As directed    Diet Carb Modified   Complete by: As directed    Discharge instructions   Complete by: As directed    Home health I have held your aldactone as your BP was on the lower side-- your PCP will check at your next visit and adjust your BP medications as needed   Increase activity slowly   Complete by: As directed      Allergies as of 04/14/2019      Reactions   Codeine Anaphylaxis      Medication List    STOP taking these medications   spironolactone 25 MG tablet Commonly known as: ALDACTONE     TAKE these medications   aspirin 325 MG EC tablet Take 1 tablet (325 mg total) by mouth daily.     atorvastatin 20 MG tablet Commonly known as: LIPITOR Take 1 tablet (20 mg total) by mouth daily at 6 PM. What changed:   medication strength  how much to take  when to take this   carvedilol 12.5 MG tablet Commonly known as: COREG Take 12.5 mg by mouth 2 (two) times daily with a meal.   gabapentin 300 MG capsule Commonly known as: NEURONTIN Take 300 mg by mouth 2 (two) times daily.   lisinopril 5 MG tablet Commonly known as: ZESTRIL Take 5 mg by mouth daily.   mirabegron ER 25 MG Tb24 tablet Commonly known as: MYRBETRIQ Take 25 mg by mouth daily.   Semaglutide (1 MG/DOSE) 2 MG/1.5ML Sopn Inject 1 mg into the skin every 7 (seven) days. Patient uses on Friday of each week   tolterodine 2 MG 24 hr capsule Commonly known as: DETROL LA Take 2 mg by mouth at bedtime.      Follow-up Information    [facility] Follow up.   Why: The home health agency will contact you for the first home visit. Contact information: [phone]           Time coordinating discharge: 25 min  Signed:  Joseph Art DO  [facility] 04/15/2019, 2:52 PM

## 2019-04-19 LAB — GLUCOSE, CAPILLARY
Glucose-Capillary: 76 mg/dL (ref 70–99)
Glucose-Capillary: 79 mg/dL (ref 70–99)
Glucose-Capillary: 83 mg/dL (ref 70–99)

## 2020-03-27 IMAGING — CT CT ANGIO CHEST-ABD-PELV FOR DISSECTION W/ AND WO/W CM
2 of 7 series · 14 of 46 positions shown, 16 images · IV contrast (omnipaque)
Comparison: None.

CLINICAL DATA: Chest and back pain, question of aortic dissection

EXAM:
CT ANGIOGRAPHY CHEST, ABDOMEN AND PELVIS
TECHNIQUE: Multidetector CT imaging through the chest, abdomen and pelvis was
performed using the standard protocol during bolus administration of
intravenous contrast. Multiplanar reconstructed images and MIPs were
obtained and reviewed to evaluate the vascular anatomy.
CONTRAST:  80mL OMNIPAQUE IOHEXOL 350 MG/ML SOLN

[Series 7: dissection 2mm · axial · 0.95mm/px · z∈[+859,+1449]mm · 11 of 331 slices shown, 13 images]
[im 18/331  soft-tissue]
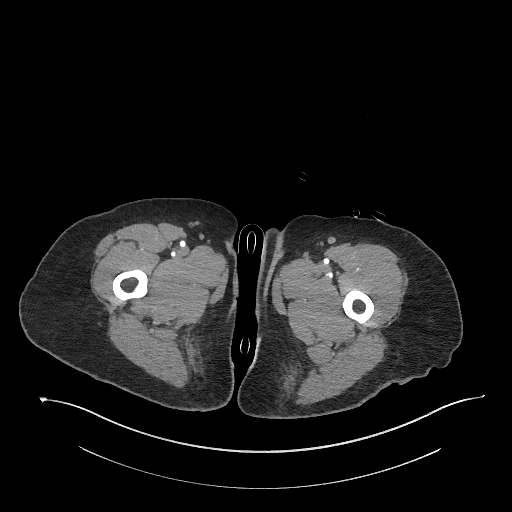
[im 18/331  bone]
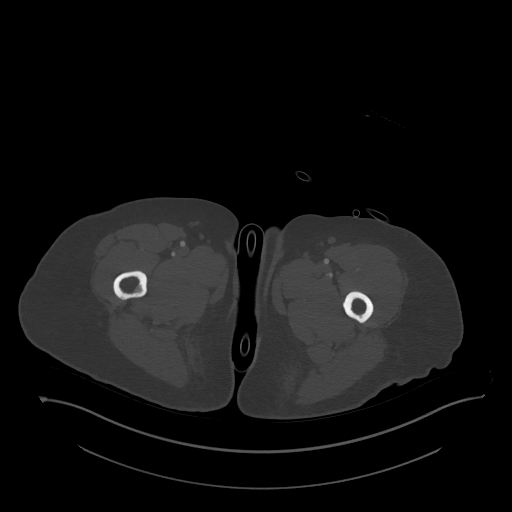
[im 53/331  soft-tissue]
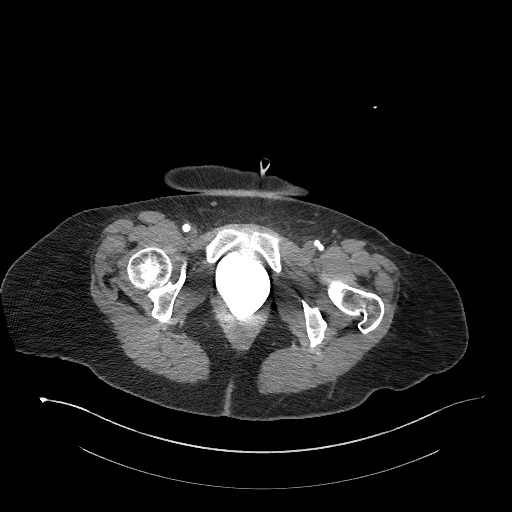
[im 87/331  soft-tissue]
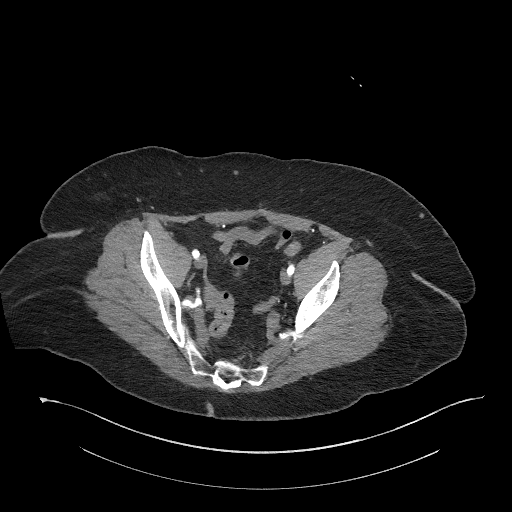
[im 105/331  soft-tissue]
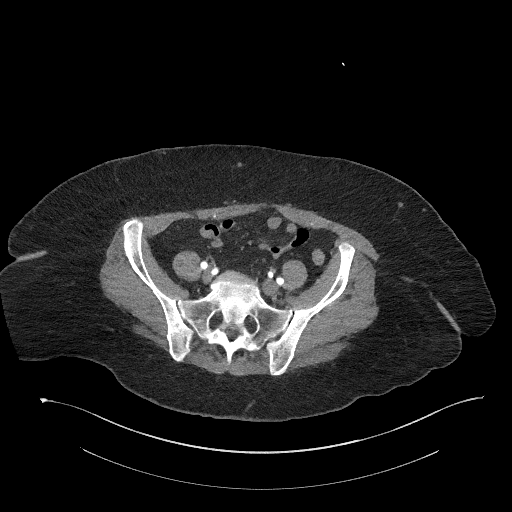
[im 139/331  soft-tissue]
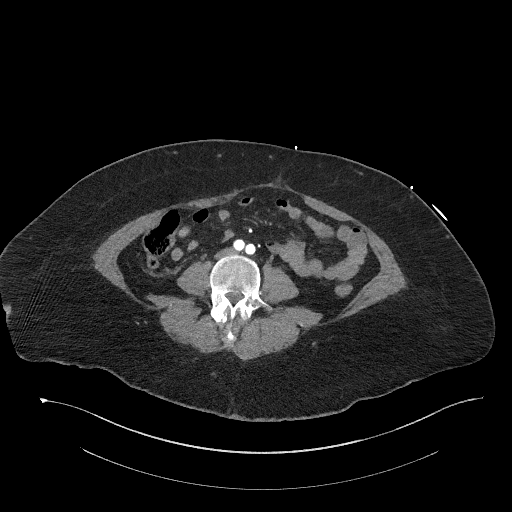
[im 174/331  soft-tissue]
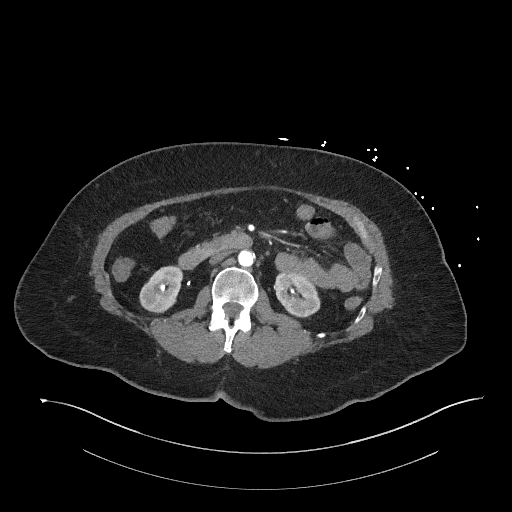
[im 192/331  soft-tissue]
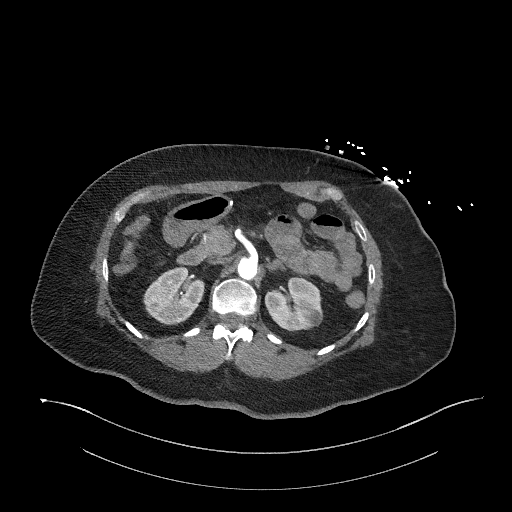
[im 226/331  soft-tissue]
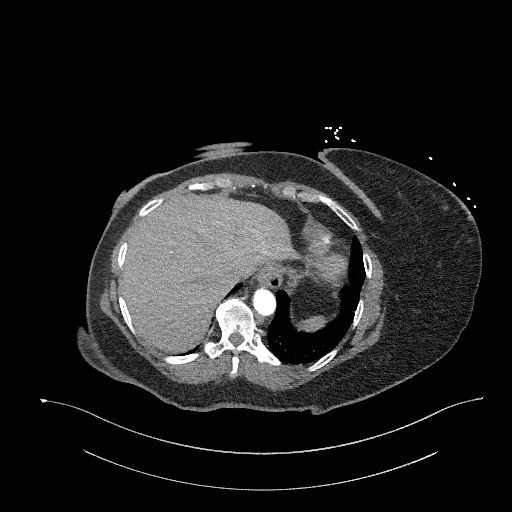
[im 244/331  soft-tissue]
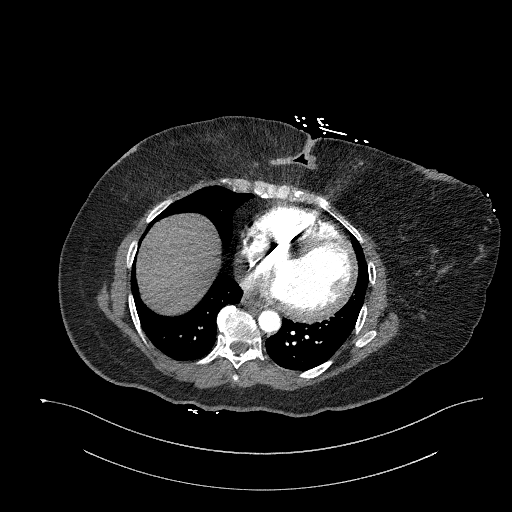
[im 244/331  bone]
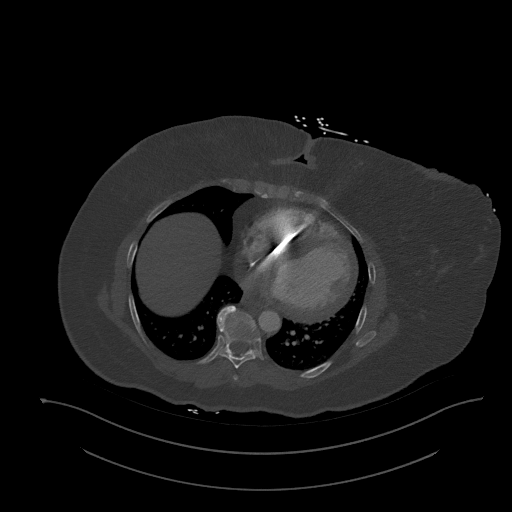
[im 278/331  soft-tissue]
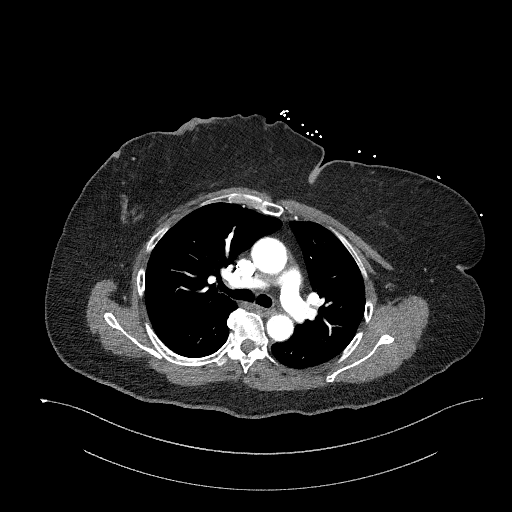
[im 313/331  soft-tissue]
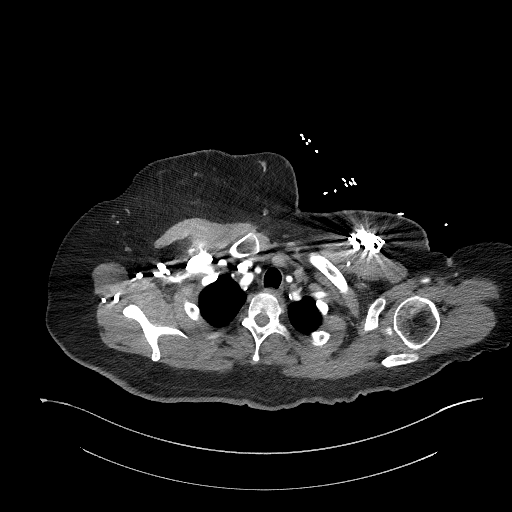

[Series 10: dissection 2mm cor · coronal · 1.00mm/px · 3 of 145 slices shown]
[im 37/145  soft-tissue]
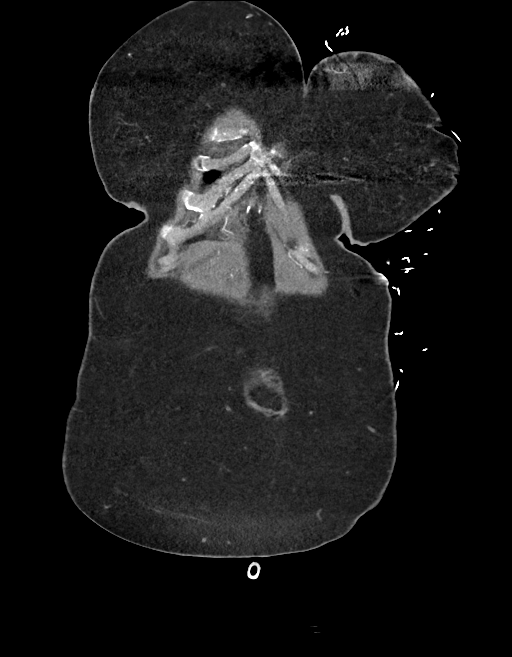
[im 73/145  soft-tissue]
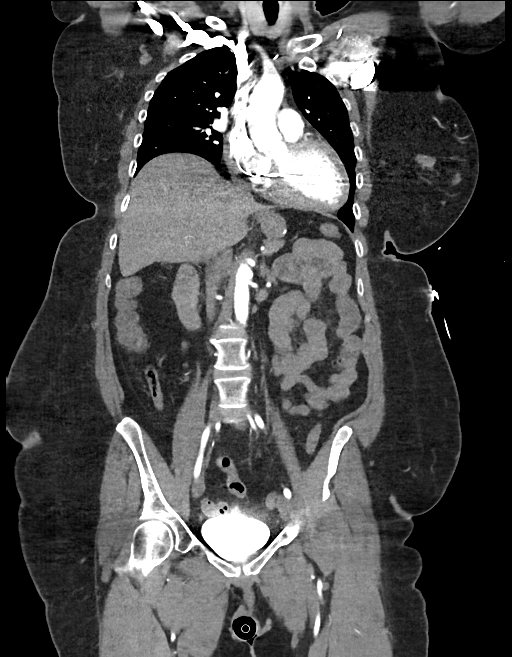
[im 109/145  soft-tissue]
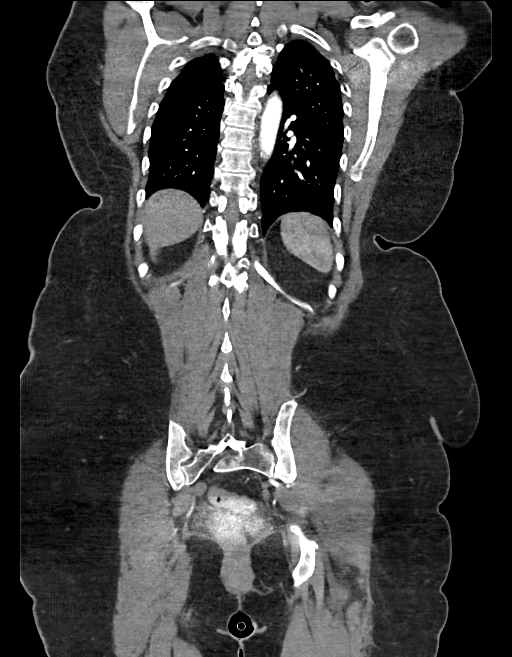

[14 of 46 positions shown; findings below may reference images not displayed]

FINDINGS: CTA CHEST FINDINGS

Cardiovascular:

--Heart: The heart size is normal.  There is nopericardial effusion.

--Aorta: The course and caliber of the thoracic aorta are normal.
There is mild scattered aortic atherosclerotic calcification.
Precontrast images show no aortic intramural hematoma. There is no
blood pool, dissection or penetrating ulcer demonstrated on arterial
phase postcontrast imaging. There is a conventional 3 vessel aortic
arch branching pattern. The proximal arch vessels are widely patent.

--Pulmonary Arteries: Contrast timing is optimized for preferential
opacification of the aorta. Within that limitation, normal central
pulmonary arteries.

Mediastinum/Nodes: A left-sided pacemaker seen with the lead tips in
the right ventricle. No mediastinal, hilar or axillary
lymphadenopathy. The visualized thyroid and thoracic esophageal
course are unremarkable.

Lungs/Pleura: No pulmonary nodules or masses. No pleural effusion or
pneumothorax. No focal airspace consolidation. No focal pleural
abnormality.

Musculoskeletal: No chest wall abnormality. No acute osseous
findings.

Review of the MIP images confirms the above findings.

CTA ABDOMEN AND PELVIS FINDINGS

VASCULAR

Aorta: Normal caliber aorta without aneurysm, dissection, vasculitis
or hemodynamically significant stenosis. There is mild aortic
atherosclerosis.

Celiac: No aneurysm, dissection or hemodynamically significant
stenosis. Normal branching pattern

SMA: Widely patent without dissection or stenosis.

Renals: Single renal arteries bilaterally. No aneurysm, dissection,
stenosis or evidence of fibromuscular dysplasia.

IMA: Patent without abnormality.

Inflow: No aneurysm, stenosis or dissection.

Veins: Normal course and caliber of the major veins. Assessment is
otherwise limited by the arterial dominant contrast phase.

Review of the MIP images confirms the above findings.

NON-VASCULAR

Hepatobiliary: Normal hepatic contours and density. No visible
biliary dilatation. The patient is status post cholecystectomy. No
biliary ductal dilation.

Pancreas: Normal contours without ductal dilatation. No
peripancreatic fluid collection.

Spleen: Normal arterial phase splenic enhancement pattern.

Adrenals/Urinary Tract:

--Adrenal glands: Normal.

--Right kidney/ureter: Multiple scattered renal calculi are noted,
less than 1 cm. No hydronephrosis however is seen.

--Left kidney/ureter: Multiple scattered renal calculi are noted,
less than 1 cm. No hydronephrosis. There is a low-density 1 cm
lesion seen in the upper pole the left kidney.

--Urinary bladder: Unremarkable.

Stomach/Bowel:

--Stomach/Duodenum: A small hiatal hernia is present. The patient
has had prior gastric sleeve.

--Small bowel: No dilatation or inflammation.

--Colon: Scattered colonic diverticula are noted without
diverticulitis.

--Appendix: Normal.

Lymphatic:  No abdominal or pelvic lymphadenopathy.

Reproductive: No free fluid in the pelvis.

Musculoskeletal. No bony spinal canal stenosis or focal osseous
abnormality. Degenerative changes are seen in the lower thoracic
spine.

Other: None.

Review of the MIP images confirms the above findings.
IMPRESSION: 1. No acute aortic abnormality.
2. Mild aortic Atherosclerosis ([id number]).
3. Multiple bilateral subcentimeter nonobstructing renal calculi.
4. No acute intrathoracic pathology.

## 2022-02-26 ENCOUNTER — Encounter (HOSPITAL_BASED_OUTPATIENT_CLINIC_OR_DEPARTMENT_OTHER): Payer: Self-pay | Admitting: Emergency Medicine

## 2022-02-26 ENCOUNTER — Emergency Department (HOSPITAL_BASED_OUTPATIENT_CLINIC_OR_DEPARTMENT_OTHER)
Admission: EM | Admit: 2022-02-26 | Discharge: 2022-02-26 | Disposition: A | Discharge status: Home / Self Care | Payer: Medicare HMO | Attending: Emergency Medicine | Admitting: Emergency Medicine

## 2022-02-26 ENCOUNTER — Other Ambulatory Visit: Payer: Self-pay

## 2022-02-26 ENCOUNTER — Emergency Department (HOSPITAL_BASED_OUTPATIENT_CLINIC_OR_DEPARTMENT_OTHER): Payer: Medicare HMO

## 2022-02-26 DIAGNOSIS — I2510 Atherosclerotic heart disease of native coronary artery without angina pectoris: Secondary | ICD-10-CM | POA: Insufficient documentation

## 2022-02-26 DIAGNOSIS — Z7982 Long term (current) use of aspirin: Secondary | ICD-10-CM | POA: Insufficient documentation

## 2022-02-26 DIAGNOSIS — W540XXA Bitten by dog, initial encounter: Secondary | ICD-10-CM | POA: Diagnosis not present

## 2022-02-26 DIAGNOSIS — I110 Hypertensive heart disease with heart failure: Secondary | ICD-10-CM | POA: Insufficient documentation

## 2022-02-26 DIAGNOSIS — Z79899 Other long term (current) drug therapy: Secondary | ICD-10-CM | POA: Insufficient documentation

## 2022-02-26 DIAGNOSIS — Z8673 Personal history of transient ischemic attack (TIA), and cerebral infarction without residual deficits: Secondary | ICD-10-CM | POA: Insufficient documentation

## 2022-02-26 DIAGNOSIS — I509 Heart failure, unspecified: Secondary | ICD-10-CM | POA: Insufficient documentation

## 2022-02-26 DIAGNOSIS — S81851A Open bite, right lower leg, initial encounter: Secondary | ICD-10-CM | POA: Insufficient documentation

## 2022-02-26 DIAGNOSIS — S56821A Laceration of other muscles, fascia and tendons at forearm level, right arm, initial encounter: Secondary | ICD-10-CM | POA: Diagnosis not present

## 2022-02-26 DIAGNOSIS — E1140 Type 2 diabetes mellitus with diabetic neuropathy, unspecified: Secondary | ICD-10-CM | POA: Diagnosis not present

## 2022-02-26 DIAGNOSIS — Z23 Encounter for immunization: Secondary | ICD-10-CM | POA: Insufficient documentation

## 2022-02-26 DIAGNOSIS — S56901A Unspecified injury of unspecified muscles, fascia and tendons at forearm level, right arm, initial encounter: Secondary | ICD-10-CM

## 2022-02-26 DIAGNOSIS — S41151A Open bite of right upper arm, initial encounter: Secondary | ICD-10-CM | POA: Diagnosis not present

## 2022-02-26 DIAGNOSIS — S4991XA Unspecified injury of right shoulder and upper arm, initial encounter: Secondary | ICD-10-CM | POA: Diagnosis present

## 2022-02-26 HISTORY — DX: Obstructive sleep apnea (adult) (pediatric): G47.33

## 2022-02-26 HISTORY — DX: Heart failure, unspecified: I50.9

## 2022-02-26 HISTORY — DX: Disorder of kidney and ureter, unspecified: N28.9

## 2022-02-26 MED ORDER — AMOXICILLIN-POT CLAVULANATE 875-125 MG PO TABS
1.0000 | ORAL_TABLET | ORAL | Status: AC
  Administered 2022-02-26: 1 via ORAL
  Filled 2022-02-26: qty 1

## 2022-02-26 MED ORDER — TETANUS-DIPHTH-ACELL PERTUSSIS 5-2.5-18.5 LF-MCG/0.5 IM SUSY
0.5000 mL | PREFILLED_SYRINGE | Freq: Once | INTRAMUSCULAR | Status: AC
Start: 2022-02-26 — End: 2022-02-26
  Administered 2022-02-26: 0.5 mL via INTRAMUSCULAR
  Filled 2022-02-26: qty 0.5

## 2022-02-26 MED ORDER — AMOXICILLIN-POT CLAVULANATE 875-125 MG PO TABS: 1.0000 | ORAL_TABLET | Freq: Two times a day (BID) | ORAL | 0 refills | Status: AC

## 2022-02-26 MED ORDER — OXYCODONE HCL 5 MG PO TABS: 5.0000 mg | ORAL_TABLET | Freq: Four times a day (QID) | ORAL | 0 refills | Status: AC | PRN

## 2022-02-26 MED ORDER — LIDOCAINE-EPINEPHRINE (PF) 2 %-1:200000 IJ SOLN
20.0000 mL | Freq: Once | INTRAMUSCULAR | Status: AC
  Administered 2022-02-26: 20 mL
  Filled 2022-02-26: qty 20

## 2022-02-26 MED ORDER — OXYCODONE HCL 5 MG PO TABS
5.0000 mg | ORAL_TABLET | ORAL | Status: AC
  Administered 2022-02-26: 5 mg via ORAL
  Filled 2022-02-26: qty 1

## 2022-02-26 MED ORDER — ACETAMINOPHEN 500 MG PO TABS
1000.0000 mg | ORAL_TABLET | ORAL | Status: AC
Start: 2022-02-26 — End: 2022-02-26
  Administered 2022-02-26: 1000 mg via ORAL
  Filled 2022-02-26: qty 2

## 2022-02-26 NOTE — Discharge Instructions (Signed)
Today you were seen in the emergency department for your your dog bite.    In the emergency department you had your wounds washed out and tetanus updated.  You are also given antibiotics.    At home, please take the antibiotics we have prescribed you to prevent infection.  Take Tylenol and Motrin as needed for your pain and oxycodone for any breakthrough pain.    Follow-up with orthopedics tomorrow in clinic for your forearm.  Follow-up with your primary doctor in 3-4 days regarding your visit.   You have nonabsorbable sutures in your arm that will need to be removed by an urgent care, emergency department or your primary doctor in 10 to 14 days.  Return immediately to the emergency department if you experience any of the following: Bleeding, new numbness or weakness, fevers, or any other concerning symptoms.    Thank you for visiting our Emergency Department. It was a pleasure taking care of you today.

## 2022-02-26 NOTE — ED Triage Notes (Signed)
Pt arrives to ED with c/o puncture wounds from dog bite to right forearm, right thigh, and right calf. The dogs vaccines are up to date. Was seen by UC initially but sen tot ED for right forearm puncture being concerning for artery involvement.

## 2022-02-26 NOTE — ED Provider Notes (Signed)
[facility] EMERGENCY DEPT Provider Note   CSN: [id number] Arrival date & time: 02/26/22  1833     History  Chief Complaint  Patient presents with   Animal Bite   Puncture Wound    Alexandra Ingram is a 66 y.o. female.  66 year old female presents with dog bite to her arm and leg.  Patient states that household dog bit her on her right arm and leg.  States that she had significant amount of bleeding at the scene and then went to urgent care.  Urgent care was concerned about possible vascular injury so dressed her wounds and referred her to the emergency department.  For that she is having difficulty moving her hand at this time but denies any numbness.  Says that the dog is up-to-date on her shots.  She is unsure of her last tetanus shot.  Says she also has bites to the leg on the right side that are less severe.  Denies any dizziness, shortness of breath, chest pain.  Not on blood thinners and has no blood clotting disorders.   Animal Bite  Past Medical History:  Diagnosis Date   Allergic rhinitis    CHF (congestive heart failure) (HCC)    Coronary artery disease    CVA (cerebral vascular accident) (HCC)    Diabetes mellitus without complication (HCC)    Hyperlipidemia    Hypertension    Neuropathy    OSA (obstructive sleep apnea)    Renal disorder    Seizures (HCC)    Stroke (HCC)    Thyroid disease       Home Medications Prior to Admission medications   Medication Sig Start Date End Date Taking? Authorizing Provider  amoxicillin-clavulanate (AUGMENTIN) 875-125 MG tablet Take 1 tablet by mouth every 12 (twelve) hours. 02/26/22  Yes Rondel Baton, MD  oxyCODONE (ROXICODONE) 5 MG immediate release tablet Take 1 tablet (5 mg total) by mouth every 6 (six) hours as needed for up to 12 doses for severe pain or breakthrough pain. 02/26/22  Yes Rondel Baton, MD  aspirin EC 325 MG EC tablet Take 1 tablet (325 mg total) by mouth daily. 04/15/19   Joseph Art,  DO  atorvastatin (LIPITOR) 20 MG tablet Take 1 tablet (20 mg total) by mouth daily at 6 PM. 04/14/19   Joseph Art, DO  carvedilol (COREG) 12.5 MG tablet Take 12.5 mg by mouth 2 (two) times daily with a meal.    [provider]  gabapentin (NEURONTIN) 300 MG capsule Take 300 mg by mouth 2 (two) times daily.    [provider]  lisinopril (ZESTRIL) 5 MG tablet Take 5 mg by mouth daily.    [provider]  mirabegron ER (MYRBETRIQ) 25 MG TB24 tablet Take 25 mg by mouth daily. 02/14/19 02/14/20  [provider]  Semaglutide, 1 MG/DOSE, 2 MG/1.5ML SOPN Inject 1 mg into the skin every 7 (seven) days. Patient uses on Friday of each week    [provider]  tolterodine (DETROL LA) 2 MG 24 hr capsule Take 2 mg by mouth at bedtime. 10/07/18   [provider]      Allergies    Codeine    Review of Systems   Review of Systems  Physical Exam Updated Vital Signs BP 122/77 (BP Location: Left Arm)   Pulse (!) 110   Temp 99.9 F (37.7 C) (Oral)   Resp 18   Ht 5\' 2"  (1.575 m)   Wt 70.8 kg  SpO2 97%   BMI 28.53 kg/m  Physical Exam Vitals and nursing note reviewed.  Constitutional:      General: She is not in acute distress.    Appearance: She is well-developed.  HENT:     Head: Normocephalic and atraumatic.     Right Ear: External ear normal.     Left Ear: External ear normal.     Nose: Nose normal.  Eyes:     Extraocular Movements: Extraocular movements intact.     Conjunctiva/sclera: Conjunctivae normal.     Pupils: Pupils are equal, round, and reactive to light.  Cardiovascular:     Rate and Rhythm: Normal rate and regular rhythm.     Heart sounds: No murmur heard.    Comments: 2+ radial pulses bilaterally Pulmonary:     Effort: Pulmonary effort is normal. No respiratory distress.     Breath sounds: Normal breath sounds.  Abdominal:     General: Abdomen is flat. There is no distension.     Palpations: Abdomen is soft. There  is no mass.     Tenderness: There is no abdominal tenderness. There is no guarding.  Musculoskeletal:        General: No swelling.       Arms:     Cervical back: Normal range of motion and neck supple.     Right lower leg: No edema.     Left lower leg: No edema.       Legs:     Comments: Unable to dorsiflex right wrist.  Full strength in wrist flexion.  Symmetrically palpable radial and ulnar pulses. Capillary refill <2 seconds to all digits.  Intact sensation to light touch of the radial, median and ulnar nerves demonstrated by testing in the dorsal web space of the thumb, the hypothenar eminence of the palm, and the radial aspect of the dorsum of the hand.  Intact strength of hand grip, thumb apposition, and ability to make "OK sign".  Difficulty with spreading fingers on right side.  No snuffbox or other bony TTP of upper extremity.    Skin:    General: Skin is warm and dry.     Capillary Refill: Capillary refill takes less than 2 seconds.  Neurological:     Mental Status: She is alert and oriented to person, place, and time. Mental status is at baseline.  Psychiatric:        Mood and Affect: Mood normal.     ED Results / Procedures / Treatments   Labs (all labs ordered are listed, but only abnormal results are displayed) Labs Reviewed - No data to display  EKG None  Radiology DG Forearm Right  Result Date: 02/26/2022 CLINICAL DATA:  Puncture wound from dog bite to the right forearm. EXAM: RIGHT FOREARM - 2 VIEW COMPARISON:  None Available. FINDINGS: There is no cortical erosion or periosteal reaction to suggest osteomyelitis. Skin irregularity and subcutaneous soft tissue edema on the dorsal/radial aspect of the forearm concerning for cellulitis. IMPRESSION: 1. No acute osseous abnormality. 2. Skin irregularity, subcutaneous soft tissue edema suggesting cellulitis, findings are suspicious for early abscess, further evaluation with CT with contrast would be helpful, if  clinically warranted. Electronically Signed   By: Larose Hires D.O.   On: 02/26/2022 21:45    Procedures .Marland KitchenLaceration Repair  Date/Time: 02/27/2022 7:37 PM  Performed by: Rondel Baton, MD Authorized by: Rondel Baton, MD   Anesthesia:    Anesthesia method:  Local infiltration   Local anesthetic:  Lidocaine 2% WITH epi Laceration details:    Location:  Shoulder/arm   Shoulder/arm location:  R lower arm   Length (cm):  5   Depth (mm):  10 Pre-procedure details:    Preparation:  Imaging obtained to evaluate for foreign bodies Exploration:    Imaging obtained: x-ray     Imaging outcome: foreign body not noted     Wound exploration: wound explored through full range of motion and entire depth of wound visualized     Wound extent: fascia violated, muscle damage and tendon damage     Wound extent: no nerve damage noted and no vascular damage noted     Tendon damage location:  Upper extremity   Upper extremity tendon damage location:  Finger extensor   Tendon damage extent:  Complete transection   Tendon repair plan:  Refer for evaluation   Contaminated: yes   Treatment:    Area cleansed with:  Saline   Amount of cleaning:  Extensive   Irrigation solution:  Sterile water   Irrigation volume:  1000 ml   Irrigation method:  Pressure wash   Visualized foreign bodies/material removed: no   Skin repair:    Repair method:  Sutures   Suture size:  3-0   Suture material:  Prolene   Suture technique:  Simple interrupted   Number of sutures:  1 Approximation:    Approximation:  Loose Repair type:    Repair type:  Simple Post-procedure details:    Dressing:  Non-adherent dressing   Procedure completion:  Tolerated well, no immediate complications Comments:     Right forearm laceration was irrigated extensively.  Found to have tendon involvement that was discussed with orthopedics.  Loose closure with packing was performed after discussing with on-call orthopedics.  Patient  tolerated procedure without difficulty.     Medications Ordered in ED Medications  lidocaine-EPINEPHrine (XYLOCAINE W/EPI) 2 %-1:200000 (PF) injection 20 mL (20 mLs Infiltration Given by Other 02/26/22 2049)  Tdap (BOOSTRIX) injection 0.5 mL (0.5 mLs Intramuscular Given 02/26/22 2150)  amoxicillin-clavulanate (AUGMENTIN) 875-125 MG per tablet 1 tablet (1 tablet Oral Given 02/26/22 2149)  oxyCODONE (Oxy IR/ROXICODONE) immediate release tablet 5 mg (5 mg Oral Given 02/26/22 2149)  acetaminophen (TYLENOL) tablet 1,000 mg (1,000 mg Oral Given 02/26/22 2148)    ED Course/ Medical Decision Making/ A&P Clinical Course as of 02/27/22 1943  Thu Feb 26, 2022  2135 Discussed with Dr Merlyn Lot [RP]    Clinical Course User Index [RP] Rondel Baton, MD                           Medical Decision Making Amount and/or Complexity of Data Reviewed Radiology: ordered.  Risk OTC drugs. Prescription drug management.   Alexandra Ingram is a 66 y.o. female who presents with chief complaint of dog bite to the right hand and right leg.  Initial Ddx:  Complex laceration with tendon/muscle involvement, nerve injury, arterial injury, anemia, retained foreign body  MDM:  Feel the patient likely has weakness in her right hand and dorsiflexion due to laceration with tendon and muscle involvement.  Intact sensation to light touch distally makes me believe that her symptoms are predominantly from tendon/muscle involvement rather than nerve injury.  Did not have any arterial bleeding when her dressings were taken down and she has 2+ radial pulses bilaterally.  Patient is not having symptomatic anemia at this time do not feel that labs are indicated.  No foreign  body sensation but will obtain x-rays to evaluate for this  Plan:  Tetanus update Wound washout and loose closure Orthopedics consult X-rays right forearm to evaluate for foreign body  ED Summary:  Patient x-ray that did not show foreign body.  Her wounds were  washed out in her upper extremity and lower extremities.  There was tendinous involvement after washing out her right forearm wound so orthopedics was consulted and recommended packing with follow-up in clinic.  Patient was given Augmentin prescription to prevent infection.  Return precautions discussed prior to discharge and she was instructed to follow-up in Ortho clinic tomorrow.  Dispo: DC Home. Return precautions discussed including, but not limited to, those listed in the AVS. Allowed pt time to ask questions which were answered fully prior to dc.   Records reviewed Care Everywhere I independently visualized the following imaging with scope of interpretation limited to determining acute life threatening conditions related to emergency care: Extremity x-ray(s), which revealed  no foreign body   Consults: Orthopedics  Final Clinical Impression(s) / ED Diagnoses Final diagnoses:  Dog bite of right upper extremity, initial encounter  Dog bite of lower leg, right, initial encounter  Open wound of right forearm with tendon involvement, initial encounter    Rx / DC Orders ED Discharge Orders          Ordered    amoxicillin-clavulanate (AUGMENTIN) 875-125 MG tablet  Every 12 hours        02/26/22 2128    oxyCODONE (ROXICODONE) 5 MG immediate release tablet  Every 6 hours PRN        02/26/22 2219              Rondel Baton, MD 02/27/22 1943

## 2022-02-26 NOTE — ED Notes (Signed)
Patient c/o 10/10 pain in right arm, wound.  Provider notified

## 2022-02-26 NOTE — ED Notes (Signed)
Reviewed AVS/discharge instruction with patient. Time allotted for and all questions answered. Patient is agreeable for d/c and escorted to ed exit by staff.   Reviewed follow up information with patient. Dr Betha Loa office contact information given patient verbalized understanding and that she will call office in AM

## 2022-11-01 ENCOUNTER — Other Ambulatory Visit: Payer: Self-pay

## 2022-11-01 ENCOUNTER — Emergency Department (HOSPITAL_COMMUNITY): Payer: Medicare HMO

## 2022-11-01 ENCOUNTER — Emergency Department (HOSPITAL_COMMUNITY)
Admission: EM | Admit: 2022-11-01 | Discharge: 2022-11-01 | Disposition: A | Discharge status: Home / Self Care | Payer: Medicare HMO | Attending: Emergency Medicine | Admitting: Emergency Medicine

## 2022-11-01 DIAGNOSIS — Z79899 Other long term (current) drug therapy: Secondary | ICD-10-CM | POA: Insufficient documentation

## 2022-11-01 DIAGNOSIS — R569 Unspecified convulsions: Secondary | ICD-10-CM

## 2022-11-01 DIAGNOSIS — I110 Hypertensive heart disease with heart failure: Secondary | ICD-10-CM | POA: Insufficient documentation

## 2022-11-01 DIAGNOSIS — Z7982 Long term (current) use of aspirin: Secondary | ICD-10-CM | POA: Diagnosis not present

## 2022-11-01 DIAGNOSIS — I10 Essential (primary) hypertension: Secondary | ICD-10-CM | POA: Insufficient documentation

## 2022-11-01 DIAGNOSIS — S060XAA Concussion with loss of consciousness status unknown, initial encounter: Secondary | ICD-10-CM

## 2022-11-01 DIAGNOSIS — I509 Heart failure, unspecified: Secondary | ICD-10-CM | POA: Diagnosis not present

## 2022-11-01 DIAGNOSIS — E119 Type 2 diabetes mellitus without complications: Secondary | ICD-10-CM | POA: Insufficient documentation

## 2022-11-01 LAB — CBC WITH DIFFERENTIAL/PLATELET
Abs Immature Granulocytes: 0.01 10*3/uL (ref 0.00–0.07)
Basophils Absolute: 0.1 10*3/uL (ref 0.0–0.1)
Basophils Relative: 1 %
Eosinophils Absolute: 0.1 10*3/uL (ref 0.0–0.5)
Eosinophils Relative: 2 %
HCT: 35.5 % — ABNORMAL LOW (ref 36.0–46.0)
Hemoglobin: 10.8 g/dL — ABNORMAL LOW (ref 12.0–15.0)
Immature Granulocytes: 0 %
Lymphocytes Relative: 46 %
Lymphs Abs: 2.5 10*3/uL (ref 0.7–4.0)
MCH: 21.9 pg — ABNORMAL LOW (ref 26.0–34.0)
MCHC: 30.4 g/dL (ref 30.0–36.0)
MCV: 72 fL — ABNORMAL LOW (ref 80.0–100.0)
Monocytes Absolute: 0.4 10*3/uL (ref 0.1–1.0)
Monocytes Relative: 8 %
Neutro Abs: 2.3 10*3/uL (ref 1.7–7.7)
Neutrophils Relative %: 43 %
Platelets: 184 10*3/uL (ref 150–400)
RBC: 4.93 MIL/uL (ref 3.87–5.11)
RDW: 16.1 % — ABNORMAL HIGH (ref 11.5–15.5)
WBC: 5.4 10*3/uL (ref 4.0–10.5)
nRBC: 0 % (ref 0.0–0.2)

## 2022-11-01 LAB — COMPREHENSIVE METABOLIC PANEL
ALT: 30 U/L (ref 0–44)
AST: 32 U/L (ref 15–41)
Albumin: 3.7 g/dL (ref 3.5–5.0)
Alkaline Phosphatase: 105 U/L (ref 38–126)
Anion gap: 10 (ref 5–15)
BUN: 19 mg/dL (ref 8–23)
CO2: 22 mmol/L (ref 22–32)
Calcium: 9.6 mg/dL (ref 8.9–10.3)
Chloride: 109 mmol/L (ref 98–111)
Creatinine, Ser: 1.12 mg/dL — ABNORMAL HIGH (ref 0.44–1.00)
GFR, Estimated: 55 mL/min — ABNORMAL LOW (ref >60)
Glucose, Bld: 76 mg/dL (ref 70–99)
Potassium: 4.2 mmol/L (ref 3.5–5.1)
Sodium: 141 mmol/L (ref 135–145)
Total Bilirubin: 0.5 mg/dL (ref 0.3–1.2)
Total Protein: 6.8 g/dL (ref 6.5–8.1)

## 2022-11-01 MED ORDER — ACETAMINOPHEN 500 MG PO TABS
1000.0000 mg | ORAL_TABLET | Freq: Four times a day (QID) | ORAL | Status: DC | PRN
  Administered 2022-11-01: 1000 mg via ORAL
  Filled 2022-11-01: qty 2

## 2022-11-01 NOTE — ED Triage Notes (Signed)
Pt to ER via after witnessed seizure.  Pt was watching a basketball game when she was struck in the face by a basketball.  Pt was then noted to immediately have clonic type seizure activity lasting approximately 1 minute.  Pt was post ictal for approx 15 mins.  Pt has past history of one other seizure but does not take medications for seizures.  Arrives alert and oriented.

## 2022-11-01 NOTE — Plan of Care (Signed)
Based on history from triage note:   Pt to ER via after witnessed seizure.  Pt was watching a basketball game when she was struck in the face by a basketball.  Pt was then noted to immediately have clonic type seizure activity lasting approximately 1 minute.  Pt was post ictal for approx 15 mins.  Pt has past history of one other seizure but does not take medications for seizures.  Arrives alert and oriented.        This is likely a concussive convulsion, a brief traumatic functional decerebration that results from loss of cortical inhibition and does not require anti seizure medications or antiepileptic medications  Basic Metabolic Panel: Recent Labs  Lab 11/01/22 1123  NA 141  K 4.2  CL 109  CO2 22  GLUCOSE 76  BUN 19  CREATININE 1.12*  CALCIUM 9.6    CBC: Recent Labs  Lab 11/01/22 1123  WBC 5.4  NEUTROABS 2.3  HGB 10.8*  HCT 35.5*  MCV 72.0*  PLT 184    Coagulation Studies: No results for input(s): "LABPROT", "INR" in the last 72 hours.   Head CT negative    These are curbside recommendations based upon the information readily available in the chart on brief review as well as history and examination information provided to me by requesting provider and do not replace a full detailed consult

## 2022-11-01 NOTE — Discharge Instructions (Addendum)
Return to the ED with any new or worsening symptoms such as continued seizure-like activity Please follow-up with neurology I referred you to.  I have placed an ambulatory referral.  They will call you in the next 3 days.  If they do not call you after 3 days, please call them with the number provided on this form.  I have referred you to [facility]. You may take Tylenol at home for pain in your nose

## 2022-11-01 NOTE — ED Provider Notes (Signed)
[facility] EMERGENCY DEPARTMENT AT [facility] Provider Note   CSN: [id number] Arrival date & time: 11/01/22  1111     History  Chief Complaint  Patient presents with   Seizures    Alexandra Ingram is a 67 y.o. female with medical history of allergic nidus, CHF, CVA, diabetes, hypertension, seizures.  Patient presents to ED for evaluation of seizure.  The patient reports that she was with her daughter earlier at a basketball game.  The patient reports that at 1 point a basketball struck her in the face.  Patient daughter reports that about 30 seconds later the patient then slumped down to the floor and began having very minimal tonic-clonic activity isolated to her upper body.  The patient daughter reports that this lasted for about 10 seconds and then the patient woke up and was very confused.  The patient daughter reports that the confusion lasted for about 5 minutes.  There was no tongue biting, there was no incontinence.  The patient reports a history of seizures in the past, she is not taking antiseizure medication, she has not seen neurology.  She is unsure when she was diagnosed.  She states that she last had a seizure about 2 years ago.  The patient is also stating that she feels as if she is having return of her Bell's palsy, she is unable to control the left side of her face.     Seizures      Home Medications Prior to Admission medications   Medication Sig Start Date End Date Taking? Authorizing Provider  amoxicillin-clavulanate (AUGMENTIN) 875-125 MG tablet Take 1 tablet by mouth every 12 (twelve) hours. 02/26/22   Rondel Baton, MD  aspirin EC 325 MG EC tablet Take 1 tablet (325 mg total) by mouth daily. 04/15/19   Joseph Art, DO  atorvastatin (LIPITOR) 20 MG tablet Take 1 tablet (20 mg total) by mouth daily at 6 PM. 04/14/19   Joseph Art, DO  carvedilol (COREG) 12.5 MG tablet Take 12.5 mg by mouth 2 (two) times daily with a meal.    [provider]  gabapentin (NEURONTIN) 300 MG capsule Take 300 mg by mouth 2 (two) times daily.    [provider]  lisinopril (ZESTRIL) 5 MG tablet Take 5 mg by mouth daily.    [provider]  mirabegron ER (MYRBETRIQ) 25 MG TB24 tablet Take 25 mg by mouth daily. 02/14/19 02/14/20  [provider]  oxyCODONE (ROXICODONE) 5 MG immediate release tablet Take 1 tablet (5 mg total) by mouth every 6 (six) hours as needed for up to 12 doses for severe pain or breakthrough pain. 02/26/22   Rondel Baton, MD  Semaglutide, 1 MG/DOSE, 2 MG/1.5ML SOPN Inject 1 mg into the skin every 7 (seven) days. Patient uses on Friday of each week    [provider]  tolterodine (DETROL LA) 2 MG 24 hr capsule Take 2 mg by mouth at bedtime. 10/07/18   [provider]      Allergies    Codeine    Review of Systems   Review of Systems  Neurological:  Positive for seizures. Negative for syncope.    Physical Exam Updated Vital Signs BP 120/70   Pulse 75   Temp 97.9 F (36.6 C) (Oral)   Resp 15   Ht 5\' 2"  (1.575 m)   Wt 69.9 kg   SpO2 99%   BMI 28.17 kg/m  Physical Exam Vitals and nursing note  reviewed.  Constitutional:      General: She is not in acute distress.    Appearance: Normal appearance. She is not ill-appearing, toxic-appearing or diaphoretic.  HENT:     Head: Normocephalic and atraumatic.     Comments: No external signs of trauma to patient face.  Tenderness to the nasal bone    Nose: Nose normal.     Mouth/Throat:     Mouth: Mucous membranes are moist.     Pharynx: Oropharynx is clear.  Eyes:     Extraocular Movements: Extraocular movements intact.     Conjunctiva/sclera: Conjunctivae normal.     Pupils: Pupils are equal, round, and reactive to light.  Cardiovascular:     Rate and Rhythm: Normal rate and regular rhythm.  Pulmonary:     Effort: Pulmonary effort is normal.     Breath sounds: Normal breath sounds. No wheezing.  Abdominal:     General:  Abdomen is flat. Bowel sounds are normal.     Palpations: Abdomen is soft.     Tenderness: There is no abdominal tenderness.  Musculoskeletal:     Cervical back: Normal range of motion and neck supple. No tenderness.  Skin:    General: Skin is warm and dry.     Capillary Refill: Capillary refill takes less than 2 seconds.  Neurological:     General: No focal deficit present.     Mental Status: She is alert and oriented to person, place, and time.     GCS: GCS eye subscore is 4. GCS verbal subscore is 5. GCS motor subscore is 6.     Cranial Nerves: Cranial nerves 2-12 are intact. No cranial nerve deficit.     Sensory: Sensation is intact. No sensory deficit.     Motor: Motor function is intact. No weakness.     Coordination: Coordination is intact. Heel to Shin Test normal.     ED Results / Procedures / Treatments   Labs (all labs ordered are listed, but only abnormal results are displayed) Labs Reviewed  COMPREHENSIVE METABOLIC PANEL - Abnormal; Notable for the following components:      Result Value   Creatinine, Ser 1.12 (*)    GFR, Estimated 55 (*)    All other components within normal limits  CBC WITH DIFFERENTIAL/PLATELET - Abnormal; Notable for the following components:   Hemoglobin 10.8 (*)    HCT 35.5 (*)    MCV 72.0 (*)    MCH 21.9 (*)    RDW 16.1 (*)    All other components within normal limits  CBG MONITORING, ED    EKG EKG Interpretation  Date/Time:  Sunday Nov 01 2022 11:23:31 EDT Ventricular Rate:  67 PR Interval:  153 QRS Duration: 105 QT Interval:  440 QTC Calculation: 465 R Axis:   70 Text Interpretation: Sinus rhythm Atrial premature complex Borderline T wave abnormalities No significant change since last tracing Confirmed by Gwyneth Sprout ([id number]) on 11/01/2022 11:59:10 AM  Radiology CT Head Wo Contrast  Result Date: 11/01/2022 CLINICAL DATA:  Seizure, new onset. EXAM: CT HEAD WITHOUT CONTRAST TECHNIQUE: Contiguous axial images were obtained  from the base of the skull through the vertex without intravenous contrast. RADIATION DOSE REDUCTION: This exam was performed according to the departmental dose-optimization program which includes automated exposure control, adjustment of the mA and/or kV according to patient size and/or use of iterative reconstruction technique. COMPARISON:  04/14/2019 FINDINGS: Brain: No evidence of acute infarction, hemorrhage, hydrocephalus, extra-axial collection or mass lesion/mass effect. Vascular: No  hyperdense vessel or unexpected calcification. Skull: Normal. Negative for fracture or focal lesion. Sinuses/Orbits: No acute finding. IMPRESSION: Negative head CT. Electronically Signed   By: Tiburcio Pea M.D.   On: 11/01/2022 12:59    Procedures Procedures   Medications Ordered in ED Medications - No data to display  ED Course/ Medical Decision Making/ A&P  Medical Decision Making Amount and/or Complexity of Data Reviewed Labs: ordered. Radiology: ordered.   67 year old female presents to ED for evaluation.  Please see HPI for further details.  On examination the patient is alert and oriented x 4.  Her neurological examination without focal neurodeficits.  She has intact finger-nose, heel-to-shin.  She does have slight facial droop on the left however she reports that this is secondary to her suspected Bell's palsy.  She is afebrile and nontachycardic.  Her lung sounds are clear bilaterally, she is not hypoxic.  Her abdomen is soft and compressible throughout.   CBC without leukocytosis, baseline hemoglobin.  CMP with creatinine 1.12 which is baseline, no electrolyte derangement.  Patient CT head without contrast does not show any evidence of intracranial abnormality.  On reassessment, patient left-sided facial droop has resolved.  Patient case discussed with attending Dr. Iver Nestle, neurology, who advises that she believes the patient had a concussive convulsion secondary to being struck in the  face.  The patient will be referred to neurology where she will follow-up for further management.  Patient pending CT maxillofacial at this time.  Will be signed out to oncoming provider Mindi Curling pending results of CT maxillofacial scan.   Final Clinical Impression(s) / ED Diagnoses Final diagnoses:  Seizure-like activity (HCC)  Concussive convulsion (HCC)    Rx / DC Orders ED Discharge Orders          Ordered    Ambulatory referral to Neurology       Comments: An appointment is requested in approximately: 1 week   11/01/22 1508              Al Decant, PA-C 11/01/22 1513    Gwyneth Sprout, MD 11/03/22 1717

## 2022-11-01 NOTE — ED Provider Notes (Signed)
Patient's care seen shift change from Delice Bison, PA-C  For full HPI please refer to previous provider's note.  In short patient presents for evaluation of seizure after being hit in the face by a basketball.  Plan is to follow-up with CT maxillofacial.  If negative patient can be discharged with neurology follow-up.  Physical Exam  BP 120/70   Pulse 75   Temp 97.9 F (36.6 C) (Oral)   Resp 15   Ht 5\' 2"  (1.575 m)   Wt 69.9 kg   SpO2 99%   BMI 28.17 kg/m   Physical Exam Vitals and nursing note reviewed.  Constitutional:      Appearance: Normal appearance.  HENT:     Head: Normocephalic and atraumatic.     Nose:     Comments: Tenderness to palpation to the nose bridge with mild skin redness.    Mouth/Throat:     Mouth: Mucous membranes are moist.  Eyes:     General: No scleral icterus. Cardiovascular:     Rate and Rhythm: Normal rate and regular rhythm.     Pulses: Normal pulses.     Heart sounds: Normal heart sounds.  Pulmonary:     Effort: Pulmonary effort is normal.     Breath sounds: Normal breath sounds.  Abdominal:     General: Abdomen is flat.     Palpations: Abdomen is soft.     Tenderness: There is no abdominal tenderness.  Musculoskeletal:        General: No deformity.  Skin:    General: Skin is warm.     Findings: No rash.  Neurological:     General: No focal deficit present.     Mental Status: She is alert.     Comments: Cranial nerves II through XII intact. Intact sensation to light touch in all 4 extremities. 5/5 strength in all 4 extremities. Intact finger-to-nose and heel-to-shin of all 4 extremities. No visual field cuts. No neglect noted. No aphasia noted.   Psychiatric:        Mood and Affect: Mood normal.     Procedures  Procedures  ED Course / MDM    Medical Decision Making Amount and/or Complexity of Data Reviewed Labs: ordered. Radiology: ordered.  Risk OTC drugs.   67 year old female presents for evaluation of seizure  after being struck by a basketball in her face.  On physical examination there was tenderness to palpation to the nose with mild skin redness.  Neuroexam with no evidence of focal neurodeficit.  CT head showed no intracranial abnormalities.  CT maxillofacial showed no evidence of acute osseous abnormality.  Neurology was consulted by previous provider. He thinks this is concussive convulsion.  Patient has no additional seizures during the course in the ER.  Patient reports history of seizure in the past but she is not taking any antiseizure medication or has seen neurology.  Ambulatory referral to neurology placed. Advised patient to take Tylenol/ibuprofen/naproxen for pain, follow-up with primary care physician  and neurology for further evaluation and management, return to the ER if new or worsening symptoms.  Disposition Continued outpatient therapy. Follow-up with PCP and neurology recommended for reevaluation of symptoms. Treatment plan discussed with patient.  Pt acknowledged understanding was agreeable to the plan. Worrisome signs and symptoms were discussed with patient, and patient acknowledged understanding to return to the ED if they noticed these signs and symptoms. Patient was stable upon discharge.   This chart was dictated using voice recognition software.  Despite best efforts  to proofread,  errors can occur which can change the documentation meaning.          Jeanelle Malling, PA 11/01/22 1628    Gerhard Munch, MD 11/02/22 0000

## 2022-11-11 ENCOUNTER — Ambulatory Visit: Payer: Medicare HMO | Admitting: Neurology

## 2022-11-11 ENCOUNTER — Encounter: Payer: Self-pay | Admitting: Neurology

## 2022-11-11 VITALS — BP 113/72 | HR 86 | Ht 62.0 in | Wt 160.0 lb

## 2022-11-11 DIAGNOSIS — S060XAA Concussion with loss of consciousness status unknown, initial encounter: Secondary | ICD-10-CM | POA: Diagnosis not present

## 2022-11-11 DIAGNOSIS — R569 Unspecified convulsions: Secondary | ICD-10-CM

## 2022-11-11 NOTE — Patient Instructions (Signed)
Continue current medication Continue Tylenol for headaches Follow-up as needed

## 2022-11-11 NOTE — Progress Notes (Signed)
[facility]  PATIENT: Alexandra Ingram DOB: 1956-01-18  REQUESTING CLINICIAN: Al Decant, P* HISTORY FROM: Patient  REASON FOR VISIT: Seizure like activity    HISTORICAL  CHIEF COMPLAINT:  Chief Complaint  Patient presents with   Hospitalization Follow-up    Rm 14, alone Seizure-like activity, concussive convulsion after being hit in the head with basketball    HISTORY OF PRESENT ILLNESS:  This is a 67 year old woman with past medical history of hypertension, hyperlipidemia, history of Bell's palsy who is presenting after having seizure-like activity on May 12.  Patient reports being at the basketball game and was hit by a basketball traveling at high-speed.  She reports collapsing and was told that she had convulsion/seizure-like episode lasting less than a minute.  She said that the next and that she remembers clearly is being on the gurney.  She was taken to the ED, had a head CT which was negative for any acute abnormality.  Since discharge from the hospital she has been complaining of mild headache for which Tylenol has been helping.  With her initial trauma, she did have nosebleeding.  She reports in the 90s having seizure-like activity but it was related to hypoglycemia.  She was never started on antiseizure medication.   Handedness: Right handed   Onset: 5/12  Seizure Type: Convulsion   Current frequency: Once  Any injuries from seizures: Denies  Seizure risk factors: Family member   Previous ASMs: None  Currenty ASMs: None   ASMs side effects: N/A  Brain Images: Normal head CT   Previous EEGs: Not available for review   OTHER MEDICAL CONDITIONS: Hypertension, Hyperlipidemia, History of Bell's Palsy   REVIEW OF SYSTEMS: Full 14 system review of systems performed and negative with exception of: As noted in the HPI   ALLERGIES: Allergies  Allergen Reactions   Codeine Anaphylaxis and Rash    Other Reaction(s): Other (See  Comments)  LIP SWELLING   Tizanidine Diarrhea    Other Reaction(s): Diarrhea    HOME MEDICATIONS: Outpatient Medications Prior to Visit  Medication Sig Dispense Refill   amoxicillin-clavulanate (AUGMENTIN) 875-125 MG tablet Take 1 tablet by mouth every 12 (twelve) hours. 14 tablet 0   aspirin EC 81 MG tablet Take 81 mg by mouth daily. Swallow whole.     atorvastatin (LIPITOR) 20 MG tablet Take 1 tablet (20 mg total) by mouth daily at 6 PM. 30 tablet 0   carvedilol (COREG) 12.5 MG tablet Take 12.5 mg by mouth 2 (two) times daily with a meal.     gabapentin (NEURONTIN) 300 MG capsule Take 300 mg by mouth 2 (two) times daily.     lisinopril (ZESTRIL) 5 MG tablet Take 5 mg by mouth daily.     mirabegron ER (MYRBETRIQ) 25 MG TB24 tablet Take 25 mg by mouth daily.     oxyCODONE (ROXICODONE) 5 MG immediate release tablet Take 1 tablet (5 mg total) by mouth every 6 (six) hours as needed for up to 12 doses for severe pain or breakthrough pain. 12 tablet 0   Semaglutide, 1 MG/DOSE, 2 MG/1.5ML SOPN Inject 1 mg into the skin every 7 (seven) days. Patient uses on Friday of each week     tolterodine (DETROL LA) 2 MG 24 hr capsule Take 2 mg by mouth at bedtime.     aspirin EC 325 MG EC tablet Take 1 tablet (325 mg total) by mouth daily. 30 tablet 0   No facility-administered medications prior to visit.  PAST MEDICAL HISTORY: Past Medical History:  Diagnosis Date   Allergic rhinitis    Bell's palsy    CHF (congestive heart failure) (HCC)    Coronary artery disease    CVA (cerebral vascular accident) (HCC)    Diabetes mellitus without complication (HCC)    Hyperlipidemia    Hypertension    Neuropathy    OSA (obstructive sleep apnea)    Renal disorder    Seizures (HCC)    Stroke (HCC)    Thyroid disease     PAST SURGICAL HISTORY: Past Surgical History:  Procedure Laterality Date   APPENDECTOMY     BARIATRIC SURGERY     CARDIAC DEFIBRILLATOR PLACEMENT     CHOLECYSTECTOMY      TONSILLECTOMY      FAMILY HISTORY: Family History  Problem Relation Age of Onset   Stroke Sister    Diabetes Mellitus II Sister    Stroke Brother     SOCIAL HISTORY: Social History   Socioeconomic History   Marital status: Single    Spouse name: Not on file   Number of children: 2   Years of education: Not on file   Highest education level: Not on file  Occupational History   Not on file  Tobacco Use   Smoking status: Never   Smokeless tobacco: Never  Vaping Use   Vaping Use: Never used  Substance and Sexual Activity   Alcohol use: Never   Drug use: Never   Sexual activity: Not Currently  Other Topics Concern   Not on file  Social History Narrative   Not on file   Social Determinants of Health   Financial Resource Strain: Not on file  Food Insecurity: Not on file  Transportation Needs: Not on file  Physical Activity: Not on file  Stress: Not on file  Social Connections: Not on file  Intimate Partner Violence: Not on file    PHYSICAL EXAM  GENERAL EXAM/CONSTITUTIONAL: Vitals:  Vitals:   11/11/22 1330  BP: 113/72  Pulse: 86  Weight: 160 lb (72.6 kg)  Height: 5\' 2"  (1.575 m)   Body mass index is 29.26 kg/m. Wt Readings from Last 3 Encounters:  11/11/22 160 lb (72.6 kg)  11/01/22 154 lb (69.9 kg)  02/26/22 156 lb (70.8 kg)   Patient is in no distress; well developed, nourished and groomed; neck is supple  MUSCULOSKELETAL: Gait, strength, tone, movements noted in Neurologic exam below  NEUROLOGIC: MENTAL STATUS:      No data to display         awake, alert, oriented to person, place and time recent and remote memory intact normal attention and concentration language fluent, comprehension intact, naming intact fund of knowledge appropriate  CRANIAL NERVE:  2nd, 3rd, 4th, 6th - Visual fields full to confrontation, extraocular muscles intact, no nystagmus 5th - facial sensation symmetric 7th - Left facial weakness, history of bell's palsy   8th - hearing intact 9th - palate elevates symmetrically, uvula midline 11th - shoulder shrug symmetric 12th - tongue protrusion midline  MOTOR:  normal bulk and tone, full strength in the BUE, BLE  SENSORY:  normal and symmetric to light touch  COORDINATION:  finger-nose-finger, fine finger movements normal  GAIT/STATION:  normal   DIAGNOSTIC DATA (LABS, IMAGING, TESTING) - I reviewed patient records, labs, notes, testing and imaging myself where available.  Lab Results  Component Value Date   WBC 5.4 11/01/2022   HGB 10.8 (L) 11/01/2022   HCT 35.5 (L) 11/01/2022  MCV 72.0 (L) 11/01/2022   PLT 184 11/01/2022      Component Value Date/Time   NA 141 11/01/2022 1123   K 4.2 11/01/2022 1123   CL 109 11/01/2022 1123   CO2 22 11/01/2022 1123   GLUCOSE 76 11/01/2022 1123   BUN 19 11/01/2022 1123   CREATININE 1.12 (H) 11/01/2022 1123   CALCIUM 9.6 11/01/2022 1123   PROT 6.8 11/01/2022 1123   ALBUMIN 3.7 11/01/2022 1123   AST 32 11/01/2022 1123   ALT 30 11/01/2022 1123   ALKPHOS 105 11/01/2022 1123   BILITOT 0.5 11/01/2022 1123   GFRNONAA 55 (L) 11/01/2022 1123   GFRAA >60 04/14/2019 0359   Lab Results  Component Value Date   CHOL 88 04/14/2019   HDL 43 04/14/2019   LDLCALC 36 04/14/2019   TRIG 47 04/14/2019   Lab Results  Component Value Date   HGBA1C 6.0 (H) 04/14/2019   No results found for: "VITAMINB12" No results found for: "TSH"  Head CT 11/01/22 Negative head CT.   ASSESSMENT AND PLAN  67 y.o. year old female  with hypertension, hyperlipidemia, history of Bell's palsy who is presenting after a seizure following head trauma.  She was hit in the head with a basketball traveling at high-speed.  Patient likely have a concussive convulsion.  She had a normal head CT.  At this time no further workup indicated but I did advise her to contact me if she has another seizure.  At that time we will do additional workup and consider starting antiseizure  medication medication.  Continue to follow with PCP return as needed.   1. Concussive convulsion [facility])     Patient Instructions  Continue current medication Continue Tylenol for headaches Follow-up as needed   Per [facility] statutes, patients with seizures are not allowed to drive until they have been seizure-free for six months.  Other recommendations include using caution when using heavy equipment or power tools. Avoid working on ladders or at heights. Take showers instead of baths.  Do not swim alone.  Ensure the water temperature is not too high on the home water heater. Do not go swimming alone. Do not lock yourself in a room alone (i.e. bathroom). When caring for infants or small children, sit down when holding, feeding, or changing them to minimize risk of injury to the child in the event you have a seizure. Maintain good sleep hygiene. Avoid alcohol.  Also recommend adequate sleep, hydration, good diet and minimize stress.   During the Seizure  - First, ensure adequate ventilation and place patients on the floor on their left side  Loosen clothing around the neck and ensure the airway is patent. If the patient is clenching the teeth, do not force the mouth open with any object as this can cause severe damage - Remove all items from the surrounding that can be hazardous. The patient may be oblivious to what's happening and may not even know what he or she is doing. If the patient is confused and wandering, either gently guide him/her away and block access to outside areas - Reassure the individual and be comforting - Call 911. In most cases, the seizure ends before EMS arrives. However, there are cases when seizures may last over 3 to 5 minutes. Or the individual may have developed breathing difficulties or severe injuries. If a pregnant patient or a person with diabetes develops a seizure, it is prudent to call an ambulance. - Finally, if the patient does  not regain full  consciousness, then call EMS. Most patients will remain confused for about 45 to 90 minutes after a seizure, so you must use judgment in calling for help. - Avoid restraints but make sure the patient is in a bed with padded side rails - Place the individual in a lateral position with the neck slightly flexed; this will help the saliva drain from the mouth and prevent the tongue from falling backward - Remove all nearby furniture and other hazards from the area - Provide verbal assurance as the individual is regaining consciousness - Provide the patient with privacy if possible - Call for help and start treatment as ordered by the caregiver   After the Seizure (Postictal Stage)  After a seizure, most patients experience confusion, fatigue, muscle pain and/or a headache. Thus, one should permit the individual to sleep. For the next few days, reassurance is essential. Being calm and helping reorient the person is also of importance.  Most seizures are painless and end spontaneously. Seizures are not harmful to others but can lead to complications such as stress on the lungs, brain and the heart. Individuals with prior lung problems may develop labored breathing and respiratory distress.     No orders of the defined types were placed in this encounter.   No orders of the defined types were placed in this encounter.   Return if symptoms worsen or fail to improve.    Windell Norfolk, MD 11/11/2022, 2:29 PM  [facility] [address], [address] [place], [place] [address] [phone]

## 2023-11-21 ENCOUNTER — Emergency Department (HOSPITAL_BASED_OUTPATIENT_CLINIC_OR_DEPARTMENT_OTHER)

## 2023-11-21 ENCOUNTER — Other Ambulatory Visit: Payer: Self-pay

## 2023-11-21 ENCOUNTER — Encounter (HOSPITAL_BASED_OUTPATIENT_CLINIC_OR_DEPARTMENT_OTHER): Payer: Self-pay | Admitting: Emergency Medicine

## 2023-11-21 ENCOUNTER — Emergency Department (HOSPITAL_BASED_OUTPATIENT_CLINIC_OR_DEPARTMENT_OTHER)
Admission: EM | Admit: 2023-11-21 | Discharge: 2023-11-21 | Disposition: A | Discharge status: Home / Self Care | Attending: Emergency Medicine | Admitting: Emergency Medicine

## 2023-11-21 DIAGNOSIS — I509 Heart failure, unspecified: Secondary | ICD-10-CM | POA: Diagnosis not present

## 2023-11-21 DIAGNOSIS — I110 Hypertensive heart disease with heart failure: Secondary | ICD-10-CM | POA: Insufficient documentation

## 2023-11-21 DIAGNOSIS — R112 Nausea with vomiting, unspecified: Secondary | ICD-10-CM | POA: Insufficient documentation

## 2023-11-21 DIAGNOSIS — R6883 Chills (without fever): Secondary | ICD-10-CM | POA: Insufficient documentation

## 2023-11-21 DIAGNOSIS — E119 Type 2 diabetes mellitus without complications: Secondary | ICD-10-CM | POA: Diagnosis not present

## 2023-11-21 DIAGNOSIS — R5381 Other malaise: Secondary | ICD-10-CM | POA: Diagnosis not present

## 2023-11-21 DIAGNOSIS — G8918 Other acute postprocedural pain: Secondary | ICD-10-CM

## 2023-11-21 DIAGNOSIS — Z7982 Long term (current) use of aspirin: Secondary | ICD-10-CM | POA: Diagnosis not present

## 2023-11-21 DIAGNOSIS — Z8673 Personal history of transient ischemic attack (TIA), and cerebral infarction without residual deficits: Secondary | ICD-10-CM | POA: Insufficient documentation

## 2023-11-21 LAB — COMPREHENSIVE METABOLIC PANEL WITH GFR
ALT: 26 U/L (ref 0–44)
AST: 26 U/L (ref 15–41)
Albumin: 4.2 g/dL (ref 3.5–5.0)
Alkaline Phosphatase: 110 U/L (ref 38–126)
Anion gap: 14 (ref 5–15)
BUN: 15 mg/dL (ref 8–23)
CO2: 23 mmol/L (ref 22–32)
Calcium: 10.3 mg/dL (ref 8.9–10.3)
Chloride: 104 mmol/L (ref 98–111)
Creatinine, Ser: 0.95 mg/dL (ref 0.44–1.00)
GFR, Estimated: >60 mL/min (ref >60)
Glucose, Bld: 114 mg/dL — ABNORMAL HIGH (ref 70–99)
Potassium: 3.9 mmol/L (ref 3.5–5.1)
Sodium: 141 mmol/L (ref 135–145)
Total Bilirubin: 0.4 mg/dL (ref 0.0–1.2)
Total Protein: 7.9 g/dL (ref 6.5–8.1)

## 2023-11-21 LAB — CBC WITH DIFFERENTIAL/PLATELET
Abs Immature Granulocytes: 0.01 10*3/uL (ref 0.00–0.07)
Basophils Absolute: 0 10*3/uL (ref 0.0–0.1)
Basophils Relative: 0 %
Eosinophils Absolute: 0.1 10*3/uL (ref 0.0–0.5)
Eosinophils Relative: 2 %
HCT: 34.7 % — ABNORMAL LOW (ref 36.0–46.0)
Hemoglobin: 11 g/dL — ABNORMAL LOW (ref 12.0–15.0)
Immature Granulocytes: 0 %
Lymphocytes Relative: 24 %
Lymphs Abs: 1.7 10*3/uL (ref 0.7–4.0)
MCH: 22.7 pg — ABNORMAL LOW (ref 26.0–34.0)
MCHC: 31.7 g/dL (ref 30.0–36.0)
MCV: 71.7 fL — ABNORMAL LOW (ref 80.0–100.0)
Monocytes Absolute: 0.4 10*3/uL (ref 0.1–1.0)
Monocytes Relative: 6 %
Neutro Abs: 4.9 10*3/uL (ref 1.7–7.7)
Neutrophils Relative %: 68 %
Platelets: 179 10*3/uL (ref 150–400)
RBC: 4.84 MIL/uL (ref 3.87–5.11)
RDW: 14.8 % (ref 11.5–15.5)
WBC: 7.2 10*3/uL (ref 4.0–10.5)
nRBC: 0 % (ref 0.0–0.2)

## 2023-11-21 LAB — LACTIC ACID, PLASMA: Lactic Acid, Venous: 1.5 mmol/L (ref 0.5–1.9)

## 2023-11-21 MED ORDER — SODIUM CHLORIDE 0.9 % IV BOLUS
1000.0000 mL | Freq: Once | INTRAVENOUS | Status: AC
  Administered 2023-11-21: 1000 mL via INTRAVENOUS

## 2023-11-21 MED ORDER — ACETAMINOPHEN 325 MG PO TABS
650.0000 mg | ORAL_TABLET | Freq: Once | ORAL | Status: AC
  Administered 2023-11-21: 650 mg via ORAL
  Filled 2023-11-21: qty 2

## 2023-11-21 MED ORDER — ONDANSETRON HCL 4 MG/2ML IJ SOLN
4.0000 mg | Freq: Once | INTRAMUSCULAR | Status: AC
  Administered 2023-11-21: 4 mg via INTRAVENOUS
  Filled 2023-11-21: qty 2

## 2023-11-21 MED ORDER — IOHEXOL 300 MG/ML  SOLN
80.0000 mL | Freq: Once | INTRAMUSCULAR | Status: AC | PRN
  Administered 2023-11-21: 80 mL via INTRAVENOUS

## 2023-11-21 NOTE — ED Notes (Signed)
 Pt dispo home. Awaiting DC orders.

## 2023-11-21 NOTE — ED Triage Notes (Signed)
 C/o increased n/v since Friday. Recent sinus surgery Denies fever. Some bleeding from nose.

## 2023-11-21 NOTE — ED Provider Notes (Signed)
 [place] EMERGENCY DEPARTMENT AT [facility] Provider Note   CSN: [id number] Arrival date & time: 11/21/23  1646     History  Chief Complaint  Patient presents with   Emesis   Post-op Problem    Alexandra Ingram is a 68 y.o. female.  With a history of stroke, hypertension, diabetes and heart failure who presents to the ED for nausea vomiting.  Patient underwent sinus surgery at [facility] with Dr. Charlynne Coombes on May 30.  She was discharged home after the surgery and since that time has experienced ongoing general malaise, nausea, vomiting and chills.  No fevers.  Intermittent nosebleeds after removing the packing today.  No abdominal pain, chest pain, shortness of breath.   Emesis      Home Medications Prior to Admission medications   Medication Sig Start Date End Date Taking? Authorizing Provider  amoxicillin -clavulanate (AUGMENTIN ) 875-125 MG tablet Take 1 tablet by mouth every 12 (twelve) hours. 02/26/22   Ninetta Basket, MD  aspirin  EC 81 MG tablet Take 81 mg by mouth daily. Swallow whole.    [provider]  atorvastatin  (LIPITOR) 20 MG tablet Take 1 tablet (20 mg total) by mouth daily at 6 PM. 04/14/19   Vann, Jessica U, DO  carvedilol (COREG) 12.5 MG tablet Take 12.5 mg by mouth 2 (two) times daily with a meal.    [provider]  gabapentin (NEURONTIN) 300 MG capsule Take 300 mg by mouth 2 (two) times daily.    [provider]  lisinopril (ZESTRIL) 5 MG tablet Take 5 mg by mouth daily.    [provider]  mirabegron ER (MYRBETRIQ) 25 MG TB24 tablet Take 25 mg by mouth daily. 02/14/19 01/24/24  [provider]  oxyCODONE  (ROXICODONE ) 5 MG immediate release tablet Take 1 tablet (5 mg total) by mouth every 6 (six) hours as needed for up to 12 doses for severe pain or breakthrough pain. 02/26/22   Ninetta Basket, MD  Semaglutide, 1 MG/DOSE, 2 MG/1.5ML SOPN Inject 1 mg into the skin every 7 (seven) days. Patient uses on Friday of  each week    [provider]  tolterodine (DETROL LA) 2 MG 24 hr capsule Take 2 mg by mouth at bedtime. 10/07/18   [provider]      Allergies    Codeine and Tizanidine    Review of Systems   Review of Systems  Gastrointestinal:  Positive for vomiting.    Physical Exam Updated Vital Signs BP 123/78 (BP Location: Right Arm)   Pulse 82   Temp 98.3 F (36.8 C) (Oral)   Resp 19   SpO2 95%  Physical Exam Vitals and nursing note reviewed.  Constitutional:      Comments: Shaking chills  HENT:     Head: Normocephalic and atraumatic.     Nose:     Comments: Dried blood bilateral nares with no septal hematoma Eyes:     Pupils: Pupils are equal, round, and reactive to light.  Cardiovascular:     Rate and Rhythm: Normal rate and regular rhythm.  Pulmonary:     Effort: Pulmonary effort is normal.     Breath sounds: Normal breath sounds.  Abdominal:     Palpations: Abdomen is soft.     Tenderness: There is no abdominal tenderness.  Skin:    General: Skin is warm and dry.  Neurological:     Mental Status: She is alert and oriented to person, place, and time.  Psychiatric:  Mood and Affect: Mood normal.     ED Results / Procedures / Treatments   Labs (all labs ordered are listed, but only abnormal results are displayed) Labs Reviewed  COMPREHENSIVE METABOLIC PANEL WITH GFR - Abnormal; Notable for the following components:      Result Value   Glucose, Bld 114 (*)    All other components within normal limits  CBC WITH DIFFERENTIAL/PLATELET - Abnormal; Notable for the following components:   Hemoglobin 11.0 (*)    HCT 34.7 (*)    MCV 71.7 (*)    MCH 22.7 (*)    All other components within normal limits  CULTURE, BLOOD (ROUTINE X 2)  CULTURE, BLOOD (ROUTINE X 2)  LACTIC ACID, PLASMA  LACTIC ACID, PLASMA    EKG EKG Interpretation Date/Time:  Sunday November 21 2023 17:39:30 EDT Ventricular Rate:  85 PR Interval:  140 QRS Duration:  96 QT  Interval:  398 QTC Calculation: 474 R Axis:   55  Text Interpretation: Sinus rhythm Borderline T abnormalities, lateral leads Confirmed by Rafael Bun [phone]) on 11/21/2023 7:35:55 PM  Radiology DG Chest Portable 1 View Result Date: 11/21/2023 EXAM: 1 VIEW XRAY OF THE CHEST 11/21/2023 06:18:04 PM COMPARISON: CTA chest dated 04/13/2019. CLINICAL HISTORY: ?pna post op. C/o increased n/v since Friday. Recent sinus surgery Denies fever. Some bleeding from nose. FINDINGS: LUNGS AND PLEURA: No focal pulmonary opacity. No pulmonary edema. No pleural effusion. No pneumothorax. HEART AND MEDIASTINUM: No acute abnormality of the cardiac and mediastinal silhouettes. BONES AND SOFT TISSUES: No acute osseous abnormality. 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 IMPRESSION: 1. No acute process. Electronically signed by: Zadie Herter MD 11/21/2023 07:12 PM EDT RP Workstation: [id number]   CT Head Wo Contrast Result Date: 11/21/2023 CLINICAL DATA:  Chills and headache.  Recent sinus surgery. EXAM: CT HEAD WITHOUT CONTRAST CT MAXILLOFACIAL WITH CONTRAST TECHNIQUE: Multidetector CT imaging of the head and maxillofacial structures were performed using the standard protocol without intravenous contrast. Maxillofacial examination does utilize IV contrast. Multiplanar CT image reconstructions of the maxillofacial structures were also generated. RADIATION DOSE REDUCTION: This exam was performed according to the departmental dose-optimization program which includes automated exposure control, adjustment of the mA and/or kV according to patient size and/or use of iterative reconstruction technique. CONTRAST:  80 cc Omnipaque  300 COMPARISON:  Maxillofacial CT 11/01/2022 FINDINGS: CT HEAD FINDINGS Brain: The brain shows a normal appearance without evidence of malformation, atrophy, old or acute small or large vessel infarction, mass lesion, hemorrhage, hydrocephalus or extra-axial collection. Vascular: No  hyperdense vessel. No evidence of atherosclerotic calcification. Skull: Normal.  No traumatic finding.  No focal bone lesion. Orbits: Negative Other: None significant CT MAXILLOFACIAL FINDINGS Osseous: No traumatic or neoplastic finding. There does appear to have been previous sinus surgery with nasoantral window creation on the left and partial ethmoidectomy. There has been opening up of the floor of both sides of the sphenoid sinus. Orbits: Negative Sinuses: Frontal sinuses are clear. Mild mucosal thickening at the frontal ethmoid junction regions. Opacified posterior ethmoid air cells, right more than left. Small amount of mucosal thickening and layering fluid in both maxillary sinuses. Mucosal thickening, fluid and possible packing material within the sphenoid sinuses. No sign of any intracranial complication. Soft tissues: Other soft tissues of the facial region appear unremarkable. IMPRESSION: HEAD CT: Normal. MAXILLOFACIAL CT: 1. Previous sinus surgery with nasoantral window creation on the left and partial ethmoidectomy. There has been opening up of the floor of both sides of  the sphenoid sinus. 2. Mucosal thickening, fluid and possible packing material within the sphenoid sinuses. No sign of any intracranial complication. 3. Mild mucosal thickening at the frontal ethmoid junction regions. Opacified posterior ethmoid air cells, right more than left. Small amount of mucosal thickening and layering fluid in both maxillary sinuses. Electronically Signed   By: Bettylou Brunner M.D.   On: 11/21/2023 18:30   CT Maxillofacial W Contrast Result Date: 11/21/2023 CLINICAL DATA:  Chills and headache.  Recent sinus surgery. EXAM: CT HEAD WITHOUT CONTRAST CT MAXILLOFACIAL WITH CONTRAST TECHNIQUE: Multidetector CT imaging of the head and maxillofacial structures were performed using the standard protocol without intravenous contrast. Maxillofacial examination does utilize IV contrast. Multiplanar CT image reconstructions of  the maxillofacial structures were also generated. RADIATION DOSE REDUCTION: This exam was performed according to the departmental dose-optimization program which includes automated exposure control, adjustment of the mA and/or kV according to patient size and/or use of iterative reconstruction technique. CONTRAST:  80 cc Omnipaque  300 COMPARISON:  Maxillofacial CT 11/01/2022 FINDINGS: CT HEAD FINDINGS Brain: The brain shows a normal appearance without evidence of malformation, atrophy, old or acute small or large vessel infarction, mass lesion, hemorrhage, hydrocephalus or extra-axial collection. Vascular: No hyperdense vessel. No evidence of atherosclerotic calcification. Skull: Normal.  No traumatic finding.  No focal bone lesion. Orbits: Negative Other: None significant CT MAXILLOFACIAL FINDINGS Osseous: No traumatic or neoplastic finding. There does appear to have been previous sinus surgery with nasoantral window creation on the left and partial ethmoidectomy. There has been opening up of the floor of both sides of the sphenoid sinus. Orbits: Negative Sinuses: Frontal sinuses are clear. Mild mucosal thickening at the frontal ethmoid junction regions. Opacified posterior ethmoid air cells, right more than left. Small amount of mucosal thickening and layering fluid in both maxillary sinuses. Mucosal thickening, fluid and possible packing material within the sphenoid sinuses. No sign of any intracranial complication. Soft tissues: Other soft tissues of the facial region appear unremarkable. IMPRESSION: HEAD CT: Normal. MAXILLOFACIAL CT: 1. Previous sinus surgery with nasoantral window creation on the left and partial ethmoidectomy. There has been opening up of the floor of both sides of the sphenoid sinus. 2. Mucosal thickening, fluid and possible packing material within the sphenoid sinuses. No sign of any intracranial complication. 3. Mild mucosal thickening at the frontal ethmoid junction regions. Opacified  posterior ethmoid air cells, right more than left. Small amount of mucosal thickening and layering fluid in both maxillary sinuses. Electronically Signed   By: Bettylou Brunner M.D.   On: 11/21/2023 18:30    Procedures Procedures    Medications Ordered in ED Medications  ondansetron (ZOFRAN) injection 4 mg (4 mg Intravenous Given 11/21/23 1738)  sodium chloride  0.9 % bolus 1,000 mL (0 mLs Intravenous Stopped 11/21/23 1931)  iohexol  (OMNIPAQUE ) 300 MG/ML solution 80 mL (80 mLs Intravenous Contrast Given 11/21/23 1803)  acetaminophen  (TYLENOL ) tablet 650 mg (650 mg Oral Given 11/21/23 2041)    ED Course/ Medical Decision Making/ A&P Clinical Course as of 11/21/23 2256  Sun Nov 21, 2023  2151 Laboratory workup shows no major abnormalities or leukocytosis.  No elevation of venous lactic.  CT head max face notable for postoperative changes and perhaps retained packing in sphenoid sinuses.  Discussed with Dr. Westley Hammers Arlin Benes, ENT on-call) as we were unable to get in touch with her [facility] ENT team.  No concerning findings.  Recommend patient follow-up with her ENT team at [facility] [MP]    Clinical Course User  Index [MP] Sallyanne Creamer, DO                                 Medical Decision Making 68 year old female with history as above including recent ENT surgery at [facility] 2 days ago presenting given concern for postoperative infection.  Nausea vomiting shaking chills.  No fevers.  Removed nasal packing earlier today and did have some residual epistaxis.  No active obvious abscess on my exam.  She is afebrile slightly hypertensive here.  Presentation most concerning for postoperative infection.  Need to consider potential toxic shock syndrome given nasal packing that was just removed today following ENT procedure 2 days ago.  Will obtain infectious workup including cultures lactate.  Will also look for evidence of postoperative infection or bleeding with a CT head sinus.  Will provide IV fluids given prolonged  vomiting, Zofran for nausea vomiting, morphine for pain control and started on antibiotics as indicated once labs are back.  Will reach out to her surgical team once workup is completed  Amount and/or Complexity of Data Reviewed Labs: ordered. Radiology: ordered.  Risk OTC drugs. Prescription drug management.           Final Clinical Impression(s) / ED Diagnoses Final diagnoses:  Postoperative pain    Rx / DC Orders ED Discharge Orders     None         Sallyanne Creamer, DO 11/21/23 2256

## 2023-11-21 NOTE — ED Notes (Signed)
 Called back for [facility] Dr. On call for Dr. Corrina Dimitri Hacem (Dr Walden Guise)  [phone] at 8:30p  spoke with Mia she repaged dr.

## 2023-11-21 NOTE — ED Notes (Signed)
 Called patient's ENT at [facility] practice on call dr Walden Guise will call back asap spoke with Mia at 8p

## 2023-11-21 NOTE — ED Notes (Signed)
 Pt ambulated to and from bathroom with steady gait. Placed back on monitor. Awaiting dispo.

## 2023-11-21 NOTE — Discharge Instructions (Signed)
 You were seen in the emerged department for pain nausea vomiting after your recent ENT operation Your blood work and CAT scan looked okay it is important that you follow-up with your [facility] ENT team as scheduled call them tomorrow to let them know about today's visit Return to the emergency room for severe pain or any other concerns

## 2023-11-21 NOTE — ED Notes (Signed)
 Pt ambulated to and from bathroom with steady gait. Updated on plan of care. Awaiting call back from ENT for dispo. Pt verbalized understanding. Family at bedside.

## 2023-11-21 NOTE — ED Notes (Signed)
 ED Provider at bedside.

## 2023-11-21 NOTE — ED Notes (Signed)
 Transport to CT

## 2023-11-26 LAB — CULTURE, BLOOD (ROUTINE X 2)
Culture: NO GROWTH
Special Requests: ADEQUATE
Special Requests: ADEQUATE
